# Patient Record
Sex: Male | Born: 1990 | Race: Black or African American | Hispanic: No | Marital: Single | Smoking: Current every day smoker
Health system: Southern US, Community
[De-identification: ages and names within clinical notes are randomized; demographics above are authoritative.]

## PROBLEM LIST (undated history)

## (undated) DIAGNOSIS — J45909 Unspecified asthma, uncomplicated: Secondary | ICD-10-CM

---
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
## 2014-09-27 ENCOUNTER — Emergency Department (HOSPITAL_COMMUNITY)
Admission: EM | Admit: 2014-09-27 | Discharge: 2014-09-27 | Disposition: A | Discharge status: Home / Self Care | Payer: Self-pay | Attending: Emergency Medicine | Admitting: Emergency Medicine

## 2014-09-27 ENCOUNTER — Encounter (HOSPITAL_COMMUNITY): Payer: Self-pay | Admitting: Emergency Medicine

## 2014-09-27 DIAGNOSIS — Z720 Tobacco use: Secondary | ICD-10-CM | POA: Insufficient documentation

## 2014-09-27 DIAGNOSIS — R300 Dysuria: Secondary | ICD-10-CM | POA: Insufficient documentation

## 2014-09-27 LAB — URINALYSIS, ROUTINE W REFLEX MICROSCOPIC
BILIRUBIN URINE: NEGATIVE
Glucose, UA: NEGATIVE mg/dL
HGB URINE DIPSTICK: NEGATIVE
Ketones, ur: NEGATIVE mg/dL
Leukocytes, UA: NEGATIVE
NITRITE: NEGATIVE
PH: 6 (ref 5.0–8.0)
Protein, ur: NEGATIVE mg/dL
SPECIFIC GRAVITY, URINE: 1.026 (ref 1.005–1.030)
UROBILINOGEN UA: 1 mg/dL (ref 0.0–1.0)

## 2014-09-27 MED ORDER — DOXYCYCLINE HYCLATE 100 MG PO CAPS: 100.0000 mg | ORAL_CAPSULE | Freq: Two times a day (BID) | ORAL | Status: DC

## 2014-09-27 NOTE — Discharge Instructions (Signed)

## 2014-09-27 NOTE — ED Notes (Signed)
Pt c/o painful urination x 1 day. Denies d/c or bleeding.

## 2014-09-27 NOTE — ED Provider Notes (Signed)
CSN: [id number]     Arrival date & time 09/27/14  1527 History  This chart was scribed for non-physician practitioner, Langston MaskerKaren Sofia, PA-C, working with Purvis SheffieldForrest Harrison, MD by Charline BillsEssence Howell, ED Scribe. This patient was seen in room WTR7/WTR7 and the patient's care was started at 4:29 PM.   Chief Complaint  Patient presents with  . Dysuria   The history is provided by the patient. No language interpreter was used.   HPI Comments: Christian Salazar is a 23 y.o. male who presents to the Emergency Department complaining of sudden onset of gradually worsening dysuria since this morning. Pt fever, chills, hematuria, penile discharge. He also denies STD exposure. No h/o kidney stones or UTIs. Pt reports alcohol and tobacco use. Pt also requests medical clearance for admittance into [facility], a substance abuse recovery program,  in [place], [place].   History reviewed. No pertinent past medical history. History reviewed. No pertinent past surgical history. No family history on file. History  Substance Use Topics  . Smoking status: Current Every Day Smoker  . Smokeless tobacco: Not on file  . Alcohol Use: Yes    Review of Systems  Constitutional: Negative for fever and chills.  Genitourinary: Positive for dysuria. Negative for hematuria and discharge.  All other systems reviewed and are negative.  Allergies  Review of patient's allergies indicates no known allergies.  Home Medications   Prior to Admission medications   Medication Sig Start Date End Date Taking? Authorizing Provider  albuterol (PROVENTIL HFA;VENTOLIN HFA) 108 (90 BASE) MCG/ACT inhaler Inhale 2 puffs into the lungs every 6 (six) hours as needed for wheezing or shortness of breath.   Yes Historical Provider, MD   Triage Vitals: BP 142/82 mmHg  Pulse 69  Temp(Src) 98 F (36.7 C) (Oral)  Resp 20  SpO2 100% Physical Exam  Constitutional: He is oriented to person, place, and time. He appears well-developed and well-nourished. No  distress.  HENT:  Head: Normocephalic and atraumatic.  Eyes: Conjunctivae and EOM are normal.  Neck: Neck supple. No tracheal deviation present.  Cardiovascular: Normal rate.   Pulmonary/Chest: Effort normal. No respiratory distress.  Musculoskeletal: Normal range of motion.  Neurological: He is alert and oriented to person, place, and time.  Skin: Skin is warm and dry.  Psychiatric: He has a normal mood and affect. His behavior is normal.  Nursing note and vitals reviewed.  ED Course  Procedures (including critical care time) DIAGNOSTIC STUDIES: Oxygen Saturation is 100% on RA, normal by my interpretation.    COORDINATION OF CARE: 4:32 PM-Discussed treatment plan which includes UA and Vibramycin with pt at bedside and pt agreed to plan.   Labs Review Labs Reviewed  GC/CHLAMYDIA PROBE AMP  URINALYSIS, ROUTINE W REFLEX MICROSCOPIC   Imaging Review No results found.   EKG Interpretation None      MDM   Final diagnoses:  Dysuria    Gc and Ct pending.   Pt given rx for doxycycline  I personally performed the services described in this documentation, which was scribed in my presence. The recorded information has been reviewed and is accurate.    Lonia SkinnerLeslie K [place]ofia, PA-C 09/27/14 1900  Purvis SheffieldForrest Harrison, MD 09/28/14 Windell [id number]

## 2014-09-29 LAB — GC/CHLAMYDIA PROBE AMP
CT PROBE, AMP APTIMA: NEGATIVE
GC PROBE AMP APTIMA: NEGATIVE

## 2015-04-20 ENCOUNTER — Emergency Department (HOSPITAL_COMMUNITY): Payer: Self-pay

## 2015-04-20 ENCOUNTER — Emergency Department (HOSPITAL_COMMUNITY)
Admission: EM | Admit: 2015-04-20 | Discharge: 2015-04-20 | Disposition: A | Discharge status: Home / Self Care | Payer: Self-pay | Attending: Emergency Medicine | Admitting: Emergency Medicine

## 2015-04-20 ENCOUNTER — Encounter (HOSPITAL_COMMUNITY): Payer: Self-pay | Admitting: Nurse Practitioner

## 2015-04-20 DIAGNOSIS — Y9301 Activity, walking, marching and hiking: Secondary | ICD-10-CM | POA: Insufficient documentation

## 2015-04-20 DIAGNOSIS — W3400XA Accidental discharge from unspecified firearms or gun, initial encounter: Secondary | ICD-10-CM

## 2015-04-20 DIAGNOSIS — Y999 Unspecified external cause status: Secondary | ICD-10-CM | POA: Insufficient documentation

## 2015-04-20 DIAGNOSIS — J45909 Unspecified asthma, uncomplicated: Secondary | ICD-10-CM | POA: Insufficient documentation

## 2015-04-20 DIAGNOSIS — S81802A Unspecified open wound, left lower leg, initial encounter: Secondary | ICD-10-CM | POA: Insufficient documentation

## 2015-04-20 DIAGNOSIS — Y92410 Unspecified street and highway as the place of occurrence of the external cause: Secondary | ICD-10-CM | POA: Insufficient documentation

## 2015-04-20 DIAGNOSIS — Z720 Tobacco use: Secondary | ICD-10-CM | POA: Insufficient documentation

## 2015-04-20 DIAGNOSIS — Z23 Encounter for immunization: Secondary | ICD-10-CM | POA: Insufficient documentation

## 2015-04-20 HISTORY — DX: Unspecified asthma, uncomplicated: J45.909

## 2015-04-20 MED ORDER — CEPHALEXIN 250 MG PO CAPS
500.0000 mg | ORAL_CAPSULE | Freq: Once | ORAL | Status: AC
  Administered 2015-04-20: 500 mg via ORAL
  Filled 2015-04-20: qty 2

## 2015-04-20 MED ORDER — DOXYCYCLINE HYCLATE 100 MG PO TABS
100.0000 mg | ORAL_TABLET | Freq: Once | ORAL | Status: AC
  Administered 2015-04-20: 100 mg via ORAL
  Filled 2015-04-20: qty 1

## 2015-04-20 MED ORDER — TETANUS-DIPHTH-ACELL PERTUSSIS 5-2.5-18.5 LF-MCG/0.5 IM SUSP
0.5000 mL | Freq: Once | INTRAMUSCULAR | Status: AC
  Administered 2015-04-20: 0.5 mL via INTRAMUSCULAR
  Filled 2015-04-20: qty 0.5

## 2015-04-20 MED ORDER — DOXYCYCLINE HYCLATE 100 MG PO TABS
100.0000 mg | ORAL_TABLET | Freq: Two times a day (BID) | ORAL | Status: DC
Start: 2015-04-20 — End: 2015-10-26

## 2015-04-20 MED ORDER — CEPHALEXIN 500 MG PO CAPS: 500.0000 mg | ORAL_CAPSULE | Freq: Two times a day (BID) | ORAL | Status: DC

## 2015-04-20 NOTE — ED Notes (Signed)
Patient transported to X-ray 
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
## 2015-10-18 ENCOUNTER — Encounter (HOSPITAL_COMMUNITY): Payer: Self-pay | Admitting: Radiology

## 2015-10-18 ENCOUNTER — Emergency Department (HOSPITAL_COMMUNITY)
Admission: EM | Admit: 2015-10-18 | Discharge: 2015-10-18 | Disposition: A | Discharge status: Home / Self Care | Payer: Self-pay | Attending: Emergency Medicine | Admitting: Emergency Medicine

## 2015-10-18 ENCOUNTER — Emergency Department (HOSPITAL_COMMUNITY): Payer: Self-pay

## 2015-10-18 DIAGNOSIS — S31803A Puncture wound without foreign body of unspecified buttock, initial encounter: Secondary | ICD-10-CM

## 2015-10-18 DIAGNOSIS — J45901 Unspecified asthma with (acute) exacerbation: Secondary | ICD-10-CM | POA: Insufficient documentation

## 2015-10-18 DIAGNOSIS — S71001A Unspecified open wound, right hip, initial encounter: Secondary | ICD-10-CM | POA: Insufficient documentation

## 2015-10-18 DIAGNOSIS — S71002A Unspecified open wound, left hip, initial encounter: Secondary | ICD-10-CM | POA: Insufficient documentation

## 2015-10-18 DIAGNOSIS — Y92009 Unspecified place in unspecified non-institutional (private) residence as the place of occurrence of the external cause: Secondary | ICD-10-CM | POA: Insufficient documentation

## 2015-10-18 DIAGNOSIS — Z23 Encounter for immunization: Secondary | ICD-10-CM | POA: Insufficient documentation

## 2015-10-18 DIAGNOSIS — Y999 Unspecified external cause status: Secondary | ICD-10-CM | POA: Insufficient documentation

## 2015-10-18 DIAGNOSIS — Y9301 Activity, walking, marching and hiking: Secondary | ICD-10-CM | POA: Insufficient documentation

## 2015-10-18 DIAGNOSIS — W3400XA Accidental discharge from unspecified firearms or gun, initial encounter: Secondary | ICD-10-CM

## 2015-10-18 LAB — COMPREHENSIVE METABOLIC PANEL
ALBUMIN: 4.2 g/dL (ref 3.5–5.0)
ALT: 13 U/L — ABNORMAL LOW (ref 17–63)
ANION GAP: 18 — AB (ref 5–15)
AST: 36 U/L (ref 15–41)
Alkaline Phosphatase: 90 U/L (ref 38–126)
BILIRUBIN TOTAL: 0.6 mg/dL (ref 0.3–1.2)
BUN: 9 mg/dL (ref 6–20)
CHLORIDE: 108 mmol/L (ref 101–111)
CO2: 16 mmol/L — AB (ref 22–32)
Calcium: 9.4 mg/dL (ref 8.9–10.3)
Creatinine, Ser: 1.63 mg/dL — ABNORMAL HIGH (ref 0.61–1.24)
GFR calc Af Amer: >60 mL/min (ref >60)
GFR calc non Af Amer: 58 mL/min — ABNORMAL LOW (ref >60)
GLUCOSE: 110 mg/dL — AB (ref 65–99)
POTASSIUM: 4.6 mmol/L (ref 3.5–5.1)
SODIUM: 142 mmol/L (ref 135–145)
TOTAL PROTEIN: 7.9 g/dL (ref 6.5–8.1)

## 2015-10-18 LAB — CDS SEROLOGY

## 2015-10-18 LAB — PREPARE FRESH FROZEN PLASMA
UNIT DIVISION: 0
Unit division: 0

## 2015-10-18 LAB — CBC
HEMATOCRIT: 46.4 % (ref 39.0–52.0)
HEMOGLOBIN: 15.3 g/dL (ref 13.0–17.0)
MCH: 31.9 pg (ref 26.0–34.0)
MCHC: 33 g/dL (ref 30.0–36.0)
MCV: 96.9 fL (ref 78.0–100.0)
Platelets: 341 10*3/uL (ref 150–400)
RBC: 4.79 MIL/uL (ref 4.22–5.81)
RDW: 13.3 % (ref 11.5–15.5)
WBC: 15.3 10*3/uL — ABNORMAL HIGH (ref 4.0–10.5)

## 2015-10-18 LAB — I-STAT CHEM 8, ED
BUN: 10 mg/dL (ref 6–20)
CHLORIDE: 108 mmol/L (ref 101–111)
CREATININE: 1.6 mg/dL — AB (ref 0.61–1.24)
Calcium, Ion: 1.07 mmol/L — ABNORMAL LOW (ref 1.12–1.23)
GLUCOSE: 104 mg/dL — AB (ref 65–99)
HEMATOCRIT: 48 % (ref 39.0–52.0)
HEMOGLOBIN: 16.3 g/dL (ref 13.0–17.0)
POTASSIUM: 4.3 mmol/L (ref 3.5–5.1)
Sodium: 142 mmol/L (ref 135–145)
TCO2: 16 mmol/L (ref 0–100)

## 2015-10-18 LAB — ABO/RH: ABO/RH(D): O NEG

## 2015-10-18 LAB — BLOOD PRODUCT ORDER (VERBAL) VERIFICATION

## 2015-10-18 LAB — PROTIME-INR
INR: 1.07 (ref 0.00–1.49)
PROTHROMBIN TIME: 14.1 s (ref 11.6–15.2)

## 2015-10-18 LAB — ETHANOL: Alcohol, Ethyl (B): 197 mg/dL — ABNORMAL HIGH (ref <5)

## 2015-10-18 MED ORDER — TETANUS-DIPHTH-ACELL PERTUSSIS 5-2.5-18.5 LF-MCG/0.5 IM SUSP
0.5000 mL | Freq: Once | INTRAMUSCULAR | Status: AC
  Administered 2015-10-18: 0.5 mL via INTRAMUSCULAR
  Filled 2015-10-18: qty 0.5

## 2015-10-18 MED ORDER — FENTANYL CITRATE (PF) 100 MCG/2ML IJ SOLN
INTRAMUSCULAR | Status: AC
  Filled 2015-10-18: qty 2

## 2015-10-18 MED ORDER — FENTANYL CITRATE (PF) 100 MCG/2ML IJ SOLN
INTRAMUSCULAR | Status: AC | PRN
  Administered 2015-10-18: 100 ug via INTRAVENOUS

## 2015-10-18 MED ORDER — ALBUTEROL SULFATE HFA 108 (90 BASE) MCG/ACT IN AERS
2.0000 | INHALATION_SPRAY | Freq: Once | RESPIRATORY_TRACT | Status: AC
  Administered 2015-10-18: 2 via RESPIRATORY_TRACT
  Filled 2015-10-18: qty 6.7

## 2015-10-18 MED ORDER — IPRATROPIUM-ALBUTEROL 0.5-2.5 (3) MG/3ML IN SOLN
3.0000 mL | Freq: Once | RESPIRATORY_TRACT | Status: AC
  Administered 2015-10-18: 3 mL via RESPIRATORY_TRACT
  Filled 2015-10-18: qty 3

## 2015-10-18 MED ORDER — IOHEXOL 300 MG/ML  SOLN
100.0000 mL | Freq: Once | INTRAMUSCULAR | Status: AC | PRN
  Administered 2015-10-18: 100 mL via INTRAVENOUS
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
## 2015-10-18 NOTE — ED Notes (Signed)
Per [employer] and [employer] pt can have all of his belongings.
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
## 2015-10-19 LAB — TYPE AND SCREEN
ABO/RH(D): O NEG
Antibody Screen: NEGATIVE
UNIT DIVISION: 0
Unit division: 0

## 2015-10-21 ENCOUNTER — Encounter (HOSPITAL_COMMUNITY): Payer: Self-pay | Admitting: Nurse Practitioner

## 2015-10-23 ENCOUNTER — Encounter (HOSPITAL_COMMUNITY): Payer: Self-pay | Admitting: *Deleted

## 2015-10-23 ENCOUNTER — Emergency Department (HOSPITAL_COMMUNITY): Payer: Self-pay

## 2015-10-23 ENCOUNTER — Inpatient Hospital Stay (HOSPITAL_COMMUNITY)
Admission: EM | Admit: 2015-10-23 | Discharge: 2015-10-26 | DRG: 603 | Disposition: A | Discharge status: Home / Self Care | Payer: Self-pay | Attending: General Surgery | Admitting: General Surgery

## 2015-10-23 DIAGNOSIS — T148XXA Other injury of unspecified body region, initial encounter: Secondary | ICD-10-CM

## 2015-10-23 DIAGNOSIS — L089 Local infection of the skin and subcutaneous tissue, unspecified: Secondary | ICD-10-CM

## 2015-10-23 DIAGNOSIS — S31814A Puncture wound with foreign body of right buttock, initial encounter: Secondary | ICD-10-CM | POA: Diagnosis present

## 2015-10-23 DIAGNOSIS — W3400XA Accidental discharge from unspecified firearms or gun, initial encounter: Secondary | ICD-10-CM

## 2015-10-23 DIAGNOSIS — L0231 Cutaneous abscess of buttock: Principal | ICD-10-CM | POA: Diagnosis present

## 2015-10-23 DIAGNOSIS — J45909 Unspecified asthma, uncomplicated: Secondary | ICD-10-CM | POA: Diagnosis present

## 2015-10-23 DIAGNOSIS — F17210 Nicotine dependence, cigarettes, uncomplicated: Secondary | ICD-10-CM | POA: Diagnosis present

## 2015-10-23 DIAGNOSIS — S31824A Puncture wound with foreign body of left buttock, initial encounter: Secondary | ICD-10-CM | POA: Diagnosis present

## 2015-10-23 LAB — CBC WITH DIFFERENTIAL/PLATELET
BASOS ABS: 0.1 10*3/uL (ref 0.0–0.1)
Basophils Relative: 1 %
EOS ABS: 0.5 10*3/uL (ref 0.0–0.7)
EOS PCT: 7 %
HCT: 39.2 % (ref 39.0–52.0)
Hemoglobin: 13.4 g/dL (ref 13.0–17.0)
LYMPHS ABS: 1.9 10*3/uL (ref 0.7–4.0)
LYMPHS PCT: 24 %
MCH: 31.8 pg (ref 26.0–34.0)
MCHC: 34.2 g/dL (ref 30.0–36.0)
MCV: 92.9 fL (ref 78.0–100.0)
MONO ABS: 0.8 10*3/uL (ref 0.1–1.0)
Monocytes Relative: 10 %
NEUTROS PCT: 58 %
Neutro Abs: 4.6 10*3/uL (ref 1.7–7.7)
PLATELETS: 366 10*3/uL (ref 150–400)
RBC: 4.22 MIL/uL (ref 4.22–5.81)
RDW: 12.4 % (ref 11.5–15.5)
WBC: 7.9 10*3/uL (ref 4.0–10.5)

## 2015-10-23 LAB — BASIC METABOLIC PANEL
Anion gap: 7 (ref 5–15)
BUN: 10 mg/dL (ref 6–20)
CALCIUM: 9.4 mg/dL (ref 8.9–10.3)
CO2: 29 mmol/L (ref 22–32)
Chloride: 104 mmol/L (ref 101–111)
Creatinine, Ser: 1.02 mg/dL (ref 0.61–1.24)
GFR calc Af Amer: >60 mL/min (ref >60)
GLUCOSE: 105 mg/dL — AB (ref 65–99)
Potassium: 4.2 mmol/L (ref 3.5–5.1)
SODIUM: 140 mmol/L (ref 135–145)

## 2015-10-23 MED ORDER — ONDANSETRON HCL 4 MG/2ML IJ SOLN
4.0000 mg | Freq: Once | INTRAMUSCULAR | Status: AC
Start: 2015-10-23 — End: 2015-10-23
  Administered 2015-10-23: 4 mg via INTRAVENOUS
  Filled 2015-10-23: qty 2

## 2015-10-23 MED ORDER — IOHEXOL 300 MG/ML  SOLN
100.0000 mL | Freq: Once | INTRAMUSCULAR | Status: AC | PRN
  Administered 2015-10-23: 100 mL via INTRAVENOUS

## 2015-10-23 MED ORDER — SODIUM CHLORIDE 0.9 % IV BOLUS (SEPSIS)
1000.0000 mL | Freq: Once | INTRAVENOUS | Status: AC
  Administered 2015-10-23: 1000 mL via INTRAVENOUS

## 2015-10-23 MED ORDER — MORPHINE SULFATE (PF) 4 MG/ML IV SOLN
4.0000 mg | Freq: Once | INTRAVENOUS | Status: AC
  Administered 2015-10-23: 4 mg via INTRAVENOUS
  Filled 2015-10-23: qty 1

## 2015-10-23 MED ORDER — CLINDAMYCIN PHOSPHATE 600 MG/50ML IV SOLN
600.0000 mg | Freq: Once | INTRAVENOUS | Status: AC
Start: 2015-10-23 — End: 2015-10-23
  Administered 2015-10-23: 600 mg via INTRAVENOUS
  Filled 2015-10-23: qty 50

## 2015-10-23 NOTE — ED Notes (Signed)
Patient transported to CT 
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
## 2015-10-23 NOTE — ED Notes (Signed)
Bed: [id number] Expected date:  Expected time:  Means of arrival:  Comments: For room 26
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
## 2015-10-24 ENCOUNTER — Observation Stay (HOSPITAL_COMMUNITY): Payer: Self-pay | Admitting: Anesthesiology

## 2015-10-24 ENCOUNTER — Encounter (HOSPITAL_COMMUNITY): Admission: EM | Disposition: A | Discharge status: Home / Self Care | Payer: Self-pay | Source: Home / Self Care

## 2015-10-24 DIAGNOSIS — L0231 Cutaneous abscess of buttock: Secondary | ICD-10-CM | POA: Diagnosis present

## 2015-10-24 DIAGNOSIS — W3400XA Accidental discharge from unspecified firearms or gun, initial encounter: Secondary | ICD-10-CM

## 2015-10-24 HISTORY — PX: INCISION AND DRAINAGE ABSCESS: SHX5864

## 2015-10-24 LAB — CBC
HEMATOCRIT: 37.2 % — AB (ref 39.0–52.0)
Hemoglobin: 12.9 g/dL — ABNORMAL LOW (ref 13.0–17.0)
MCH: 32.5 pg (ref 26.0–34.0)
MCHC: 34.7 g/dL (ref 30.0–36.0)
MCV: 93.7 fL (ref 78.0–100.0)
PLATELETS: 363 10*3/uL (ref 150–400)
RBC: 3.97 MIL/uL — AB (ref 4.22–5.81)
RDW: 12.5 % (ref 11.5–15.5)
WBC: 8.1 10*3/uL (ref 4.0–10.5)

## 2015-10-24 LAB — CREATININE, SERUM
Creatinine, Ser: 0.97 mg/dL (ref 0.61–1.24)
GFR calc non Af Amer: >60 mL/min (ref >60)

## 2015-10-24 SURGERY — INCISION AND DRAINAGE, ABSCESS
Anesthesia: General | Site: Buttocks | Laterality: Bilateral

## 2015-10-24 MED ORDER — MEPERIDINE HCL 25 MG/ML IJ SOLN
6.2500 mg | INTRAMUSCULAR | Status: DC | PRN
  Administered 2015-10-24 (×2): 12.5 mg via INTRAVENOUS

## 2015-10-24 MED ORDER — SODIUM CHLORIDE 0.9 % IR SOLN
Status: DC | PRN
  Administered 2015-10-24: 1000 mL

## 2015-10-24 MED ORDER — GLYCOPYRROLATE 0.2 MG/ML IJ SOLN
INTRAMUSCULAR | Status: DC | PRN
  Administered 2015-10-24: .6 mg via INTRAVENOUS

## 2015-10-24 MED ORDER — NEOSTIGMINE METHYLSULFATE 10 MG/10ML IV SOLN
INTRAVENOUS | Status: DC | PRN
  Administered 2015-10-24: 4 mg via INTRAVENOUS

## 2015-10-24 MED ORDER — HEPARIN SODIUM (PORCINE) 5000 UNIT/ML IJ SOLN
5000.0000 [IU] | Freq: Three times a day (TID) | INTRAMUSCULAR | Status: DC
  Administered 2015-10-24 – 2015-10-26 (×6): 5000 [IU] via SUBCUTANEOUS
  Filled 2015-10-24 (×5): qty 1

## 2015-10-24 MED ORDER — MIDAZOLAM HCL 2 MG/2ML IJ SOLN
INTRAMUSCULAR | Status: AC
  Filled 2015-10-24: qty 2

## 2015-10-24 MED ORDER — DEXTROSE 5 % IV SOLN
1.0000 g | INTRAVENOUS | Status: DC
  Administered 2015-10-24 – 2015-10-25 (×2): 1 g via INTRAVENOUS
  Filled 2015-10-24 (×3): qty 10

## 2015-10-24 MED ORDER — LIDOCAINE HCL (CARDIAC) 20 MG/ML IV SOLN
INTRAVENOUS | Status: DC | PRN
  Administered 2015-10-24: 60 mg via INTRAVENOUS

## 2015-10-24 MED ORDER — MORPHINE SULFATE (PF) 2 MG/ML IV SOLN
1.0000 mg | INTRAVENOUS | Status: DC | PRN
  Administered 2015-10-24 – 2015-10-25 (×5): 2 mg via INTRAVENOUS
  Filled 2015-10-24 (×5): qty 1

## 2015-10-24 MED ORDER — MORPHINE SULFATE (PF) 2 MG/ML IV SOLN: 1.0000 mg | INTRAVENOUS | Status: DC | PRN

## 2015-10-24 MED ORDER — VANCOMYCIN HCL IN DEXTROSE 1-5 GM/200ML-% IV SOLN
1000.0000 mg | INTRAVENOUS | Status: AC
  Administered 2015-10-24: 1000 mg via INTRAVENOUS
  Filled 2015-10-24: qty 200

## 2015-10-24 MED ORDER — ONDANSETRON HCL 4 MG/2ML IJ SOLN
INTRAMUSCULAR | Status: DC | PRN
  Administered 2015-10-24: 4 mg via INTRAVENOUS

## 2015-10-24 MED ORDER — NEOSTIGMINE METHYLSULFATE 10 MG/10ML IV SOLN
INTRAVENOUS | Status: AC
  Filled 2015-10-24: qty 1

## 2015-10-24 MED ORDER — DEXTROSE 5 % IV SOLN
2.0000 g | Freq: Once | INTRAVENOUS | Status: AC
  Administered 2015-10-24: 2 g via INTRAVENOUS
  Filled 2015-10-24: qty 2

## 2015-10-24 MED ORDER — PROPOFOL 10 MG/ML IV BOLUS
INTRAVENOUS | Status: DC | PRN
  Administered 2015-10-24: 20 mg via INTRAVENOUS
  Administered 2015-10-24: 160 mg via INTRAVENOUS

## 2015-10-24 MED ORDER — ONDANSETRON HCL 4 MG PO TABS: 4.0000 mg | ORAL_TABLET | Freq: Four times a day (QID) | ORAL | Status: DC | PRN

## 2015-10-24 MED ORDER — ONDANSETRON HCL 4 MG/2ML IJ SOLN
INTRAMUSCULAR | Status: AC
  Filled 2015-10-24: qty 2

## 2015-10-24 MED ORDER — ROCURONIUM BROMIDE 50 MG/5ML IV SOLN
INTRAVENOUS | Status: AC
  Filled 2015-10-24: qty 1

## 2015-10-24 MED ORDER — LACTATED RINGERS IV SOLN
INTRAVENOUS | Status: DC | PRN
  Administered 2015-10-24: 11:00:00 via INTRAVENOUS

## 2015-10-24 MED ORDER — KCL IN DEXTROSE-NACL 20-5-0.45 MEQ/L-%-% IV SOLN
INTRAVENOUS | Status: DC
  Administered 2015-10-24 (×2): via INTRAVENOUS
  Filled 2015-10-24 (×2): qty 1000

## 2015-10-24 MED ORDER — FENTANYL CITRATE (PF) 100 MCG/2ML IJ SOLN
INTRAMUSCULAR | Status: AC
  Filled 2015-10-24: qty 2

## 2015-10-24 MED ORDER — MIDAZOLAM HCL 5 MG/5ML IJ SOLN
INTRAMUSCULAR | Status: DC | PRN
  Administered 2015-10-24: 2 mg via INTRAVENOUS

## 2015-10-24 MED ORDER — ONDANSETRON HCL 4 MG/2ML IJ SOLN: 4.0000 mg | Freq: Once | INTRAMUSCULAR | Status: DC | PRN

## 2015-10-24 MED ORDER — OXYCODONE HCL 5 MG PO TABS
5.0000 mg | ORAL_TABLET | ORAL | Status: DC | PRN
  Administered 2015-10-24 – 2015-10-26 (×7): 10 mg via ORAL
  Filled 2015-10-24 (×7): qty 2

## 2015-10-24 MED ORDER — PROPOFOL 10 MG/ML IV BOLUS
INTRAVENOUS | Status: AC
  Filled 2015-10-24: qty 20

## 2015-10-24 MED ORDER — GLYCOPYRROLATE 0.2 MG/ML IJ SOLN
INTRAMUSCULAR | Status: AC
  Filled 2015-10-24: qty 1

## 2015-10-24 MED ORDER — LIDOCAINE HCL (CARDIAC) 20 MG/ML IV SOLN
INTRAVENOUS | Status: AC
  Filled 2015-10-24: qty 5

## 2015-10-24 MED ORDER — MEPERIDINE HCL 25 MG/ML IJ SOLN
INTRAMUSCULAR | Status: AC
  Filled 2015-10-24: qty 1

## 2015-10-24 MED ORDER — FENTANYL CITRATE (PF) 100 MCG/2ML IJ SOLN
25.0000 ug | INTRAMUSCULAR | Status: DC | PRN
  Administered 2015-10-24 (×4): 25 ug via INTRAVENOUS

## 2015-10-24 MED ORDER — ROCURONIUM BROMIDE 100 MG/10ML IV SOLN
INTRAVENOUS | Status: DC | PRN
  Administered 2015-10-24: 40 mg via INTRAVENOUS

## 2015-10-24 MED ORDER — LACTATED RINGERS IV SOLN
INTRAVENOUS | Status: DC
  Administered 2015-10-24: 11:00:00 via INTRAVENOUS

## 2015-10-24 MED ORDER — FENTANYL CITRATE (PF) 100 MCG/2ML IJ SOLN
INTRAMUSCULAR | Status: DC | PRN
  Administered 2015-10-24: 50 ug via INTRAVENOUS
  Administered 2015-10-24: 150 ug via INTRAVENOUS
  Administered 2015-10-24: 50 ug via INTRAVENOUS

## 2015-10-24 MED ORDER — ONDANSETRON HCL 4 MG/2ML IJ SOLN: 4.0000 mg | Freq: Four times a day (QID) | INTRAMUSCULAR | Status: DC | PRN

## 2015-10-24 MED ORDER — FENTANYL CITRATE (PF) 250 MCG/5ML IJ SOLN
INTRAMUSCULAR | Status: AC
  Filled 2015-10-24: qty 5

## 2015-10-24 SURGICAL SUPPLY — 29 items
BNDG GAUZE ELAST 4 BULKY (GAUZE/BANDAGES/DRESSINGS) IMPLANT
CANISTER SUCTION 2500CC (MISCELLANEOUS) ×3 IMPLANT
COVER SURGICAL LIGHT HANDLE (MISCELLANEOUS) ×3 IMPLANT
DRAIN PENROSE 1/4X12 LTX STRL (WOUND CARE) ×6 IMPLANT
DRAPE LAPAROSCOPIC ABDOMINAL (DRAPES) ×3 IMPLANT
DRAPE UTILITY XL STRL (DRAPES) ×6 IMPLANT
DRSG PAD ABDOMINAL 8X10 ST (GAUZE/BANDAGES/DRESSINGS) ×3 IMPLANT
ELECT CAUTERY BLADE 6.4 (BLADE) ×3 IMPLANT
ELECT REM PT RETURN 9FT ADLT (ELECTROSURGICAL) ×3
ELECTRODE REM PT RTRN 9FT ADLT (ELECTROSURGICAL) ×1 IMPLANT
GAUZE SPONGE 4X4 12PLY STRL (GAUZE/BANDAGES/DRESSINGS) ×3 IMPLANT
GLOVE BIOGEL PI IND STRL 7.0 (GLOVE) ×2 IMPLANT
GLOVE BIOGEL PI IND STRL 8 (GLOVE) ×1 IMPLANT
GLOVE BIOGEL PI INDICATOR 7.0 (GLOVE) ×4
GLOVE BIOGEL PI INDICATOR 8 (GLOVE) ×2
GLOVE ECLIPSE 7.5 STRL STRAW (GLOVE) ×6 IMPLANT
GOWN STRL REUS W/ TWL LRG LVL3 (GOWN DISPOSABLE) ×2 IMPLANT
GOWN STRL REUS W/TWL LRG LVL3 (GOWN DISPOSABLE) ×4
KIT BASIN OR (CUSTOM PROCEDURE TRAY) ×3 IMPLANT
KIT ROOM TURNOVER OR (KITS) ×3 IMPLANT
NS IRRIG 1000ML POUR BTL (IV SOLUTION) ×3 IMPLANT
PACK GENERAL/GYN (CUSTOM PROCEDURE TRAY) ×3 IMPLANT
PAD ARMBOARD 7.5X6 YLW CONV (MISCELLANEOUS) ×3 IMPLANT
SUCTION POOLE TIP (SUCTIONS) ×3 IMPLANT
SUT ETHILON 3 0 FSL (SUTURE) ×3 IMPLANT
SWAB COLLECTION DEVICE MRSA (MISCELLANEOUS) IMPLANT
TOWEL OR 17X24 6PK STRL BLUE (TOWEL DISPOSABLE) ×3 IMPLANT
TOWEL OR 17X26 10 PK STRL BLUE (TOWEL DISPOSABLE) IMPLANT
TUBE ANAEROBIC SPECIMEN COL (MISCELLANEOUS) IMPLANT

## 2015-10-24 NOTE — Anesthesia Preprocedure Evaluation (Addendum)
Anesthesia Evaluation  Patient identified by MRN, date of birth, ID band Patient awake    Reviewed: Allergy & Precautions, NPO status , Patient's Chart, lab work & pertinent test results  History of Anesthesia Complications Negative for: history of anesthetic complications  Airway Mallampati: II  TM Distance: >3 FB Neck ROM: Full    Dental no notable dental hx. (+) Dental Advisory Given   Pulmonary asthma , Current Smoker,    Pulmonary exam normal breath sounds clear to auscultation       Cardiovascular negative cardio ROS Normal cardiovascular exam Rhythm:Regular Rate:Normal     Neuro/Psych negative neurological ROS  negative psych ROS   GI/Hepatic negative GI ROS, Neg liver ROS,   Endo/Other  negative endocrine ROS  Renal/GU negative Renal ROS  negative genitourinary   Musculoskeletal negative musculoskeletal ROS (+)   Abdominal   Peds negative pediatric ROS (+)  Hematology negative hematology ROS (+)   Anesthesia Other Findings   Reproductive/Obstetrics negative OB ROS                            Anesthesia Physical Anesthesia Plan  ASA: II  Anesthesia Plan: General   Post-op Pain Management:    Induction: Intravenous  Airway Management Planned: Oral ETT  Additional Equipment:   Intra-op Plan:   Post-operative Plan: Extubation in OR  Informed Consent: I have reviewed the patients History and Physical, chart, labs and discussed the procedure including the risks, benefits and alternatives for the proposed anesthesia with the patient or authorized representative who has indicated his/her understanding and acceptance.   Dental advisory given  Plan Discussed with: CRNA  Anesthesia Plan Comments:         Anesthesia Quick Evaluation

## 2015-10-24 NOTE — Progress Notes (Signed)
Looks like a lot of infected serous fluid from the left gluteal wound.  Will examine the right gluteal wound also at the time of surgery.  Needs I&D in the OR.  Marta LamasJames O. Gae BonWyatt, III, MD, FACS [phone] Trauma Surgeon
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
## 2015-10-24 NOTE — Progress Notes (Signed)
Pt is transported to OR
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
## 2015-10-24 NOTE — Anesthesia Postprocedure Evaluation (Signed)
Anesthesia Post Note  Patient: Christian Salazar  Procedure(s) Performed: Procedure(s) (LRB): INCISION AND DRAINAGE ABSCESS (Bilateral)  Patient location during evaluation: PACU Anesthesia Type: General Level of consciousness: awake and alert Pain management: pain level controlled Vital Signs Assessment: post-procedure vital signs reviewed and stable Respiratory status: spontaneous breathing, nonlabored ventilation, respiratory function stable and patient connected to nasal cannula oxygen Cardiovascular status: blood pressure returned to baseline and stable Postop Assessment: no signs of nausea or vomiting Anesthetic complications: no    Last Vitals:  Filed Vitals:   10/24/15 1352 10/24/15 1433  BP:  110/50  Pulse:  60  Temp: 36.8 C 37 C  Resp:      Last Pain:  Filed Vitals:   10/24/15 1434  PainSc: Asleep                 Yoshi Mancillas JENNETTE

## 2015-10-24 NOTE — Progress Notes (Signed)
Pt is transferred back to 5N14 from PACU. Readmission vital sign is stable.

## 2015-10-24 NOTE — Anesthesia Procedure Notes (Signed)
Procedure Name: Intubation Date/Time: 10/24/2015 11:36 AM Performed by: Virgel GessHOLTZMAN, Shemar Plemmons LEFFEW Pre-anesthesia Checklist: Patient identified, Patient being monitored, Timeout performed, Emergency Drugs available and Suction available Patient Re-evaluated:Patient Re-evaluated prior to inductionOxygen Delivery Method: Circle System Utilized Preoxygenation: Pre-oxygenation with 100% oxygen Intubation Type: IV induction Ventilation: Mask ventilation without difficulty Laryngoscope Size: Mac and 3 Grade View: Grade I Tube type: Oral Tube size: 7.5 mm Number of attempts: 1 Airway Equipment and Method: Stylet Placement Confirmation: ETT inserted through vocal cords under direct vision,  positive ETCO2 and breath sounds checked- equal and bilateral Secured at: 23 cm Tube secured with: Tape Dental Injury: Teeth and Oropharynx as per pre-operative assessment

## 2015-10-24 NOTE — Transfer of Care (Signed)
Immediate Anesthesia Transfer of Care Note  Patient: Christian Salazar  Procedure(s) Performed: Procedure(s) with comments: INCISION AND DRAINAGE ABSCESS (Bilateral) - Prone position  Patient Location: PACU  Anesthesia Type:General  Level of Consciousness: awake, alert , patient cooperative and responds to stimulation  Airway & Oxygen Therapy: Patient Spontanous Breathing  Post-op Assessment: Report given to RN, Post -op Vital signs reviewed and stable and Patient moving all extremities X 4  Post vital signs: Reviewed and stable  Last Vitals:  Filed Vitals:   10/24/15 0251 10/24/15 0548  BP: 135/63 117/65  Pulse: 73 60  Temp: 37.3 C 37.1 C  Resp: 18 18    Complications: No apparent anesthesia complications

## 2015-10-24 NOTE — Progress Notes (Addendum)
Pt starting moaning, grimacing and crying in pain just when the bed pad was changed under his buttocks. He is reluctant to move in the bed because of his pain. Morphine given for pain with only moderate relief. Dr. Magnus IvanBlackman notified since pt is suppose to have dressing changed in a.m. He stated he did not want to add any pain meds and to  delay dressing change until MD sees pt in the morning.

## 2015-10-25 ENCOUNTER — Encounter (HOSPITAL_COMMUNITY): Payer: Self-pay | Admitting: General Surgery

## 2015-10-25 MED ORDER — METHOCARBAMOL 750 MG PO TABS
750.0000 mg | ORAL_TABLET | Freq: Three times a day (TID) | ORAL | Status: DC
  Administered 2015-10-25 – 2015-10-26 (×5): 750 mg via ORAL
  Filled 2015-10-25 (×5): qty 1

## 2015-10-25 MED ORDER — KETOROLAC TROMETHAMINE 15 MG/ML IJ SOLN
15.0000 mg | Freq: Four times a day (QID) | INTRAMUSCULAR | Status: DC
  Administered 2015-10-25 – 2015-10-26 (×5): 15 mg via INTRAVENOUS
  Filled 2015-10-25 (×5): qty 1

## 2015-10-25 MED ORDER — KETOROLAC TROMETHAMINE 30 MG/ML IJ SOLN
30.0000 mg | Freq: Once | INTRAMUSCULAR | Status: AC
  Administered 2015-10-25: 30 mg via INTRAVENOUS
  Filled 2015-10-25: qty 1

## 2015-10-25 MED ORDER — INFLUENZA VAC SPLIT QUAD 0.5 ML IM SUSY
0.5000 mL | PREFILLED_SYRINGE | INTRAMUSCULAR | Status: AC
Start: 2015-10-26 — End: 2015-10-26
  Administered 2015-10-26: 0.5 mL via INTRAMUSCULAR
  Filled 2015-10-25: qty 0.5
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
## 2015-10-26 MED ORDER — AMOXICILLIN-POT CLAVULANATE 875-125 MG PO TABS: 1.0000 | ORAL_TABLET | Freq: Two times a day (BID) | ORAL | Status: DC

## 2015-10-26 MED ORDER — METHOCARBAMOL 750 MG PO TABS: 750.0000 mg | ORAL_TABLET | Freq: Four times a day (QID) | ORAL | Status: DC | PRN

## 2015-10-26 MED ORDER — OXYCODONE-ACETAMINOPHEN 5-325 MG PO TABS: 1.0000 | ORAL_TABLET | ORAL | Status: DC | PRN
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
## 2015-10-26 NOTE — Discharge Instructions (Signed)
Dry dressings to drain sites, change daily or more often if needed. Ok to shower with dressings off; do not soak wounds in tub. No driving while taking oxycodone.

## 2015-10-29 LAB — CULTURE, ROUTINE-ABSCESS

## 2015-10-30 LAB — ANAEROBIC CULTURE
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
## 2019-12-27 ENCOUNTER — Emergency Department (HOSPITAL_COMMUNITY)
Admission: EM | Admit: 2019-12-27 | Discharge: 2019-12-27 | Disposition: A | Discharge status: Home / Self Care | Payer: Self-pay | Attending: Emergency Medicine | Admitting: Emergency Medicine

## 2019-12-27 ENCOUNTER — Other Ambulatory Visit: Payer: Self-pay

## 2019-12-27 ENCOUNTER — Encounter (HOSPITAL_COMMUNITY): Payer: Self-pay

## 2019-12-27 DIAGNOSIS — H9201 Otalgia, right ear: Secondary | ICD-10-CM | POA: Insufficient documentation

## 2019-12-27 DIAGNOSIS — H6091 Unspecified otitis externa, right ear: Secondary | ICD-10-CM | POA: Insufficient documentation

## 2019-12-27 DIAGNOSIS — F17210 Nicotine dependence, cigarettes, uncomplicated: Secondary | ICD-10-CM | POA: Insufficient documentation

## 2019-12-27 DIAGNOSIS — F1210 Cannabis abuse, uncomplicated: Secondary | ICD-10-CM | POA: Insufficient documentation

## 2019-12-27 DIAGNOSIS — J45909 Unspecified asthma, uncomplicated: Secondary | ICD-10-CM | POA: Insufficient documentation

## 2019-12-27 MED ORDER — OFLOXACIN 0.3 % OT SOLN: 5.0000 [drp] | Freq: Two times a day (BID) | OTIC | 0 refills | Status: AC
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
## 2019-12-27 NOTE — ED Triage Notes (Signed)
Patient c/o right ear pain since yesterday and states, "It feels like something crawling in my right ear."

## 2019-12-27 NOTE — Discharge Instructions (Addendum)
Please use 1:1 dilution of 3% hydrogen peroxide if tympanic membrane is visible and intact.  I have sent a prescription for an antibiotic that you may use twice daily for the next week.  I recommend that you wait and see how your symptoms are tomorrow and if they are worse he may use antibiotic.  If they are not worse or are not improving I recommend just using Tylenol and ibuprofen for pain.   Please use Tylenol or ibuprofen for pain.  You may use 600 mg ibuprofen every 6 hours or 1000 mg of Tylenol every 6 hours.  You may choose to alternate between the 2.  This would be most effective.  Not to exceed 4 g of Tylenol within 24 hours.  Not to exceed 3200 mg ibuprofen 24 hours.

## 2020-05-09 ENCOUNTER — Other Ambulatory Visit: Payer: Self-pay

## 2020-05-09 ENCOUNTER — Encounter (HOSPITAL_COMMUNITY): Payer: Self-pay | Admitting: Emergency Medicine

## 2020-05-09 ENCOUNTER — Inpatient Hospital Stay (HOSPITAL_COMMUNITY)
Admission: EM | Admit: 2020-05-09 | Discharge: 2020-05-13 | DRG: 027 | Disposition: A | Discharge status: Home / Self Care | Payer: Self-pay | Attending: General Surgery | Admitting: General Surgery

## 2020-05-09 ENCOUNTER — Emergency Department (HOSPITAL_COMMUNITY): Payer: Self-pay

## 2020-05-09 DIAGNOSIS — S0193XA Puncture wound without foreign body of unspecified part of head, initial encounter: Secondary | ICD-10-CM

## 2020-05-09 DIAGNOSIS — S161XXA Strain of muscle, fascia and tendon at neck level, initial encounter: Secondary | ICD-10-CM | POA: Diagnosis present

## 2020-05-09 DIAGNOSIS — S065X9A Traumatic subdural hemorrhage with loss of consciousness of unspecified duration, initial encounter: Secondary | ICD-10-CM | POA: Diagnosis present

## 2020-05-09 DIAGNOSIS — Z23 Encounter for immunization: Secondary | ICD-10-CM

## 2020-05-09 DIAGNOSIS — S065XAA Traumatic subdural hemorrhage with loss of consciousness status unknown, initial encounter: Secondary | ICD-10-CM

## 2020-05-09 DIAGNOSIS — R402412 Glasgow coma scale score 13-15, at arrival to emergency department: Secondary | ICD-10-CM | POA: Diagnosis present

## 2020-05-09 DIAGNOSIS — Z20822 Contact with and (suspected) exposure to covid-19: Secondary | ICD-10-CM | POA: Diagnosis present

## 2020-05-09 DIAGNOSIS — J45909 Unspecified asthma, uncomplicated: Secondary | ICD-10-CM | POA: Diagnosis present

## 2020-05-09 DIAGNOSIS — F17210 Nicotine dependence, cigarettes, uncomplicated: Secondary | ICD-10-CM | POA: Diagnosis present

## 2020-05-09 DIAGNOSIS — W3400XA Accidental discharge from unspecified firearms or gun, initial encounter: Secondary | ICD-10-CM

## 2020-05-09 DIAGNOSIS — S020XXB Fracture of vault of skull, initial encounter for open fracture: Principal | ICD-10-CM | POA: Diagnosis present

## 2020-05-09 DIAGNOSIS — S0291XB Unspecified fracture of skull, initial encounter for open fracture: Secondary | ICD-10-CM | POA: Diagnosis present

## 2020-05-09 LAB — CBC WITH DIFFERENTIAL/PLATELET
Abs Immature Granulocytes: 0.01 10*3/uL (ref 0.00–0.07)
Basophils Absolute: 0.1 10*3/uL (ref 0.0–0.1)
Basophils Relative: 1 %
Eosinophils Absolute: 0.2 10*3/uL (ref 0.0–0.5)
Eosinophils Relative: 4 %
HCT: 44.6 % (ref 39.0–52.0)
Hemoglobin: 15.3 g/dL (ref 13.0–17.0)
Immature Granulocytes: 0 %
Lymphocytes Relative: 51 %
Lymphs Abs: 2.9 10*3/uL (ref 0.7–4.0)
MCH: 33.3 pg (ref 26.0–34.0)
MCHC: 34.3 g/dL (ref 30.0–36.0)
MCV: 97 fL (ref 80.0–100.0)
Monocytes Absolute: 0.6 10*3/uL (ref 0.1–1.0)
Monocytes Relative: 11 %
Neutro Abs: 1.9 10*3/uL (ref 1.7–7.7)
Neutrophils Relative %: 33 %
Platelets: 285 10*3/uL (ref 150–400)
RBC: 4.6 MIL/uL (ref 4.22–5.81)
RDW: 12.6 % (ref 11.5–15.5)
WBC: 5.7 10*3/uL (ref 4.0–10.5)
nRBC: 0 % (ref 0.0–0.2)

## 2020-05-09 LAB — COMPREHENSIVE METABOLIC PANEL
ALT: 17 U/L (ref 0–44)
AST: 25 U/L (ref 15–41)
Albumin: 4.5 g/dL (ref 3.5–5.0)
Alkaline Phosphatase: 73 U/L (ref 38–126)
Anion gap: 15 (ref 5–15)
BUN: 12 mg/dL (ref 6–20)
CO2: 23 mmol/L (ref 22–32)
Calcium: 9.4 mg/dL (ref 8.9–10.3)
Chloride: 100 mmol/L (ref 98–111)
Creatinine, Ser: 1.42 mg/dL — ABNORMAL HIGH (ref 0.61–1.24)
GFR calc Af Amer: >60 mL/min (ref >60)
GFR calc non Af Amer: >60 mL/min (ref >60)
Glucose, Bld: 109 mg/dL — ABNORMAL HIGH (ref 70–99)
Potassium: 3.6 mmol/L (ref 3.5–5.1)
Sodium: 138 mmol/L (ref 135–145)
Total Bilirubin: 0.3 mg/dL (ref 0.3–1.2)
Total Protein: 8 g/dL (ref 6.5–8.1)

## 2020-05-09 LAB — ETHANOL: Alcohol, Ethyl (B): <10 mg/dL (ref <10)

## 2020-05-09 LAB — TYPE AND SCREEN
ABO/RH(D): O NEG
Antibody Screen: NEGATIVE

## 2020-05-09 LAB — SARS CORONAVIRUS 2 BY RT PCR (HOSPITAL ORDER, PERFORMED IN ~~LOC~~ HOSPITAL LAB): SARS Coronavirus 2: NEGATIVE

## 2020-05-09 MED ORDER — ONDANSETRON HCL 4 MG/2ML IJ SOLN
INTRAMUSCULAR | Status: AC
  Administered 2020-05-09: 4 mg via INTRAVENOUS
  Filled 2020-05-09: qty 2

## 2020-05-09 MED ORDER — CLINDAMYCIN PHOSPHATE 900 MG/50ML IV SOLN
900.0000 mg | Freq: Once | INTRAVENOUS | Status: AC
  Administered 2020-05-09: 900 mg via INTRAVENOUS
  Filled 2020-05-09: qty 50

## 2020-05-09 MED ORDER — CEFAZOLIN SODIUM-DEXTROSE 2-4 GM/100ML-% IV SOLN
2.0000 g | Freq: Once | INTRAVENOUS | Status: AC
Start: 2020-05-09 — End: 2020-05-10
  Administered 2020-05-09: 2 g via INTRAVENOUS
  Filled 2020-05-09 (×2): qty 100

## 2020-05-09 MED ORDER — TETANUS-DIPHTH-ACELL PERTUSSIS 5-2.5-18.5 LF-MCG/0.5 IM SUSP
0.5000 mL | Freq: Once | INTRAMUSCULAR | Status: AC
  Administered 2020-05-09: 0.5 mL via INTRAMUSCULAR
  Filled 2020-05-09: qty 0.5

## 2020-05-09 MED ORDER — FENTANYL CITRATE (PF) 100 MCG/2ML IJ SOLN
50.0000 ug | Freq: Once | INTRAMUSCULAR | Status: AC
  Administered 2020-05-09: 50 ug via INTRAVENOUS
  Filled 2020-05-09: qty 2

## 2020-05-09 MED ORDER — ONDANSETRON HCL 4 MG/2ML IJ SOLN: 4.0000 mg | Freq: Four times a day (QID) | INTRAMUSCULAR | Status: DC | PRN

## 2020-05-09 NOTE — ED Notes (Signed)
[facility] at bedside 
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
## 2020-05-10 ENCOUNTER — Emergency Department (HOSPITAL_COMMUNITY): Payer: Self-pay | Admitting: Certified Registered"

## 2020-05-10 ENCOUNTER — Encounter (HOSPITAL_COMMUNITY): Payer: Self-pay | Admitting: Neurosurgery

## 2020-05-10 ENCOUNTER — Encounter (HOSPITAL_COMMUNITY): Admission: EM | Disposition: A | Discharge status: Home / Self Care | Payer: Self-pay | Source: Home / Self Care

## 2020-05-10 DIAGNOSIS — W3400XA Accidental discharge from unspecified firearms or gun, initial encounter: Secondary | ICD-10-CM

## 2020-05-10 DIAGNOSIS — S0291XB Unspecified fracture of skull, initial encounter for open fracture: Secondary | ICD-10-CM | POA: Diagnosis present

## 2020-05-10 HISTORY — PX: CRANIOTOMY: SHX93

## 2020-05-10 LAB — BASIC METABOLIC PANEL
Anion gap: 11 (ref 5–15)
BUN: 8 mg/dL (ref 6–20)
CO2: 22 mmol/L (ref 22–32)
Calcium: 9.3 mg/dL (ref 8.9–10.3)
Chloride: 104 mmol/L (ref 98–111)
Creatinine, Ser: 1.23 mg/dL (ref 0.61–1.24)
GFR calc Af Amer: >60 mL/min (ref >60)
GFR calc non Af Amer: >60 mL/min (ref >60)
Glucose, Bld: 134 mg/dL — ABNORMAL HIGH (ref 70–99)
Potassium: 4.3 mmol/L (ref 3.5–5.1)
Sodium: 137 mmol/L (ref 135–145)

## 2020-05-10 LAB — RAPID URINE DRUG SCREEN, HOSP PERFORMED
Amphetamines: NOT DETECTED
Barbiturates: NOT DETECTED
Benzodiazepines: POSITIVE — AB
Cocaine: POSITIVE — AB
Opiates: POSITIVE — AB
Tetrahydrocannabinol: POSITIVE — AB

## 2020-05-10 LAB — CBC
HCT: 43.3 % (ref 39.0–52.0)
Hemoglobin: 14.8 g/dL (ref 13.0–17.0)
MCH: 33.3 pg (ref 26.0–34.0)
MCHC: 34.2 g/dL (ref 30.0–36.0)
MCV: 97.5 fL (ref 80.0–100.0)
Platelets: 257 10*3/uL (ref 150–400)
RBC: 4.44 MIL/uL (ref 4.22–5.81)
RDW: 12.5 % (ref 11.5–15.5)
WBC: 11.4 10*3/uL — ABNORMAL HIGH (ref 4.0–10.5)
nRBC: 0 % (ref 0.0–0.2)

## 2020-05-10 LAB — MRSA PCR SCREENING: MRSA by PCR: NEGATIVE

## 2020-05-10 LAB — TYPE AND SCREEN
ABO/RH(D): O NEG
Antibody Screen: NEGATIVE

## 2020-05-10 LAB — HIV ANTIBODY (ROUTINE TESTING W REFLEX): HIV Screen 4th Generation wRfx: NONREACTIVE

## 2020-05-10 SURGERY — CRANIOTOMY HEMATOMA EVACUATION SUBDURAL
Anesthesia: General | Site: Head

## 2020-05-10 MED ORDER — PNEUMOCOCCAL VAC POLYVALENT 25 MCG/0.5ML IJ INJ
0.5000 mL | INJECTION | INTRAMUSCULAR | Status: AC
  Administered 2020-05-11: 0.5 mL via INTRAMUSCULAR
  Filled 2020-05-10: qty 0.5

## 2020-05-10 MED ORDER — FENTANYL CITRATE (PF) 250 MCG/5ML IJ SOLN
INTRAMUSCULAR | Status: AC
  Filled 2020-05-10: qty 5

## 2020-05-10 MED ORDER — OXYCODONE HCL 5 MG PO TABS
5.0000 mg | ORAL_TABLET | ORAL | Status: DC | PRN
  Administered 2020-05-10 – 2020-05-12 (×3): 10 mg via ORAL
  Administered 2020-05-12: 5 mg via ORAL
  Administered 2020-05-13: 10 mg via ORAL
  Filled 2020-05-10 (×5): qty 2
  Filled 2020-05-10: qty 1

## 2020-05-10 MED ORDER — ONDANSETRON 4 MG PO TBDP: 4.0000 mg | ORAL_TABLET | Freq: Four times a day (QID) | ORAL | Status: DC | PRN

## 2020-05-10 MED ORDER — ALBUTEROL SULFATE (2.5 MG/3ML) 0.083% IN NEBU: 3.0000 mL | INHALATION_SOLUTION | RESPIRATORY_TRACT | Status: DC | PRN

## 2020-05-10 MED ORDER — NALOXONE HCL 0.4 MG/ML IJ SOLN: 0.0800 mg | INTRAMUSCULAR | Status: DC | PRN

## 2020-05-10 MED ORDER — PROPOFOL 10 MG/ML IV BOLUS
INTRAVENOUS | Status: AC
  Filled 2020-05-10: qty 20

## 2020-05-10 MED ORDER — PROMETHAZINE HCL 25 MG PO TABS: 12.5000 mg | ORAL_TABLET | ORAL | Status: DC | PRN

## 2020-05-10 MED ORDER — PROCHLORPERAZINE EDISYLATE 10 MG/2ML IJ SOLN: 5.0000 mg | Freq: Four times a day (QID) | INTRAMUSCULAR | Status: DC | PRN

## 2020-05-10 MED ORDER — LIDOCAINE HCL 1 % IJ SOLN
INTRAMUSCULAR | Status: DC | PRN
Start: 2020-05-10 — End: 2020-05-10
  Administered 2020-05-10: 50 mg via INTRADERMAL

## 2020-05-10 MED ORDER — ROCURONIUM 10MG/ML (10ML) SYRINGE FOR MEDFUSION PUMP - OPTIME
INTRAVENOUS | Status: DC | PRN
  Administered 2020-05-10: 50 mg via INTRAVENOUS

## 2020-05-10 MED ORDER — HYDROMORPHONE HCL 1 MG/ML IJ SOLN: 0.2500 mg | INTRAMUSCULAR | Status: DC | PRN

## 2020-05-10 MED ORDER — ONDANSETRON HCL 4 MG/2ML IJ SOLN
4.0000 mg | INTRAMUSCULAR | Status: DC | PRN
Start: 2020-05-10 — End: 2020-05-10

## 2020-05-10 MED ORDER — LEVETIRACETAM IN NACL 500 MG/100ML IV SOLN
500.0000 mg | Freq: Two times a day (BID) | INTRAVENOUS | Status: DC
  Administered 2020-05-10 – 2020-05-12 (×5): 500 mg via INTRAVENOUS
  Filled 2020-05-10 (×5): qty 100

## 2020-05-10 MED ORDER — SODIUM CHLORIDE 0.9 % IV SOLN
INTRAVENOUS | Status: DC | PRN
Start: 2020-05-10 — End: 2020-05-10

## 2020-05-10 MED ORDER — LABETALOL HCL 5 MG/ML IV SOLN: 10.0000 mg | INTRAVENOUS | Status: DC | PRN

## 2020-05-10 MED ORDER — PHENYLEPHRINE HCL (PRESSORS) 10 MG/ML IV SOLN
INTRAVENOUS | Status: DC | PRN
Start: 2020-05-10 — End: 2020-05-10
  Administered 2020-05-10 (×2): 40 ug via INTRAVENOUS

## 2020-05-10 MED ORDER — ACETAMINOPHEN 325 MG PO TABS: 325.0000 mg | ORAL_TABLET | Freq: Once | ORAL | Status: DC | PRN

## 2020-05-10 MED ORDER — PROPOFOL 10 MG/ML IV BOLUS
INTRAVENOUS | Status: DC | PRN
  Administered 2020-05-10: 150 mg via INTRAVENOUS

## 2020-05-10 MED ORDER — MORPHINE SULFATE (PF) 2 MG/ML IV SOLN: 1.0000 mg | INTRAVENOUS | Status: DC | PRN

## 2020-05-10 MED ORDER — PROMETHAZINE HCL 25 MG/ML IJ SOLN: 6.2500 mg | INTRAMUSCULAR | Status: DC | PRN

## 2020-05-10 MED ORDER — CHLORHEXIDINE GLUCONATE CLOTH 2 % EX PADS
6.0000 | MEDICATED_PAD | Freq: Every day | CUTANEOUS | Status: DC
  Administered 2020-05-10 – 2020-05-13 (×5): 6 via TOPICAL

## 2020-05-10 MED ORDER — THROMBIN 20000 UNITS EX SOLR
CUTANEOUS | Status: DC | PRN
  Administered 2020-05-10: 20 mL via TOPICAL

## 2020-05-10 MED ORDER — MEPERIDINE HCL 25 MG/ML IJ SOLN: 6.2500 mg | INTRAMUSCULAR | Status: DC | PRN

## 2020-05-10 MED ORDER — ONDANSETRON HCL 4 MG/2ML IJ SOLN: 4.0000 mg | Freq: Four times a day (QID) | INTRAMUSCULAR | Status: DC | PRN

## 2020-05-10 MED ORDER — ACETAMINOPHEN 325 MG PO TABS
650.0000 mg | ORAL_TABLET | ORAL | Status: DC | PRN
  Administered 2020-05-10 – 2020-05-11 (×2): 650 mg via ORAL
  Filled 2020-05-10 (×2): qty 2

## 2020-05-10 MED ORDER — ACETAMINOPHEN 10 MG/ML IV SOLN: 1000.0000 mg | Freq: Once | INTRAVENOUS | Status: DC | PRN

## 2020-05-10 MED ORDER — LIDOCAINE-EPINEPHRINE 0.5 %-1:200000 IJ SOLN
INTRAMUSCULAR | Status: AC
  Filled 2020-05-10: qty 1

## 2020-05-10 MED ORDER — LIDOCAINE-EPINEPHRINE 0.5 %-1:200000 IJ SOLN
INTRAMUSCULAR | Status: DC | PRN
  Administered 2020-05-10: 8 mL via INTRADERMAL

## 2020-05-10 MED ORDER — BACITRACIN ZINC 500 UNIT/GM EX OINT
TOPICAL_OINTMENT | CUTANEOUS | Status: DC | PRN
  Administered 2020-05-10: 1 via TOPICAL

## 2020-05-10 MED ORDER — KCL IN DEXTROSE-NACL 20-5-0.45 MEQ/L-%-% IV SOLN
INTRAVENOUS | Status: AC
  Filled 2020-05-10: qty 1000

## 2020-05-10 MED ORDER — METHOCARBAMOL 750 MG PO TABS
750.0000 mg | ORAL_TABLET | Freq: Three times a day (TID) | ORAL | Status: DC | PRN
  Administered 2020-05-10 – 2020-05-13 (×3): 750 mg via ORAL
  Filled 2020-05-10: qty 1
  Filled 2020-05-10: qty 2
  Filled 2020-05-10: qty 1

## 2020-05-10 MED ORDER — ONDANSETRON HCL 4 MG PO TABS: 4.0000 mg | ORAL_TABLET | ORAL | Status: DC | PRN

## 2020-05-10 MED ORDER — HEPARIN SODIUM (PORCINE) 5000 UNIT/ML IJ SOLN
5000.0000 [IU] | Freq: Three times a day (TID) | INTRAMUSCULAR | Status: DC
  Filled 2020-05-10: qty 1

## 2020-05-10 MED ORDER — SUCCINYLCHOLINE 20MG/ML (10ML) SYRINGE FOR MEDFUSION PUMP - OPTIME
INTRAMUSCULAR | Status: DC | PRN
  Administered 2020-05-10: 140 mg via INTRAVENOUS

## 2020-05-10 MED ORDER — FENTANYL CITRATE (PF) 250 MCG/5ML IJ SOLN
INTRAMUSCULAR | Status: DC | PRN
  Administered 2020-05-10: 150 ug via INTRAVENOUS
  Administered 2020-05-10 (×2): 50 ug via INTRAVENOUS

## 2020-05-10 MED ORDER — 0.9 % SODIUM CHLORIDE (POUR BTL) OPTIME
TOPICAL | Status: DC | PRN
  Administered 2020-05-10 (×2): 1000 mL

## 2020-05-10 MED ORDER — HEPARIN SODIUM (PORCINE) 5000 UNIT/ML IJ SOLN
5000.0000 [IU] | Freq: Three times a day (TID) | INTRAMUSCULAR | Status: DC
  Administered 2020-05-11 – 2020-05-13 (×6): 5000 [IU] via SUBCUTANEOUS
  Filled 2020-05-10 (×8): qty 1

## 2020-05-10 MED ORDER — SODIUM CHLORIDE 0.9 % IV SOLN: INTRAVENOUS | Status: DC | PRN

## 2020-05-10 MED ORDER — POTASSIUM CHLORIDE IN NACL 20-0.9 MEQ/L-% IV SOLN: INTRAVENOUS | Status: DC

## 2020-05-10 MED ORDER — MORPHINE SULFATE (PF) 2 MG/ML IV SOLN
1.0000 mg | INTRAVENOUS | Status: DC | PRN
  Administered 2020-05-10: 1 mg via INTRAVENOUS
  Filled 2020-05-10: qty 1

## 2020-05-10 MED ORDER — ONDANSETRON HCL 4 MG/2ML IJ SOLN
INTRAMUSCULAR | Status: AC
  Filled 2020-05-10: qty 2

## 2020-05-10 MED ORDER — ALBUTEROL SULFATE HFA 108 (90 BASE) MCG/ACT IN AERS: 2.0000 | INHALATION_SPRAY | Freq: Four times a day (QID) | RESPIRATORY_TRACT | Status: DC | PRN

## 2020-05-10 MED ORDER — PROCHLORPERAZINE MALEATE 10 MG PO TABS
10.0000 mg | ORAL_TABLET | Freq: Four times a day (QID) | ORAL | Status: DC | PRN
  Filled 2020-05-10: qty 1

## 2020-05-10 MED ORDER — FENTANYL CITRATE (PF) 100 MCG/2ML IJ SOLN
50.0000 ug | Freq: Once | INTRAMUSCULAR | Status: AC
  Administered 2020-05-10: 50 ug via INTRAVENOUS
  Filled 2020-05-10: qty 2

## 2020-05-10 MED ORDER — BACITRACIN ZINC 500 UNIT/GM EX OINT
TOPICAL_OINTMENT | CUTANEOUS | Status: AC
  Filled 2020-05-10: qty 28.35

## 2020-05-10 MED ORDER — ACETAMINOPHEN 160 MG/5ML PO SOLN: 325.0000 mg | Freq: Once | ORAL | Status: DC | PRN

## 2020-05-10 MED ORDER — DEXMEDETOMIDINE HCL 200 MCG/2ML IV SOLN
INTRAVENOUS | Status: DC | PRN
  Administered 2020-05-10: 8 ug via INTRAVENOUS
  Administered 2020-05-10: 4 ug via INTRAVENOUS
  Administered 2020-05-10: 8 ug via INTRAVENOUS

## 2020-05-10 MED ORDER — THROMBIN 20000 UNITS EX SOLR
CUTANEOUS | Status: AC
  Filled 2020-05-10: qty 20000

## 2020-05-10 MED ORDER — HYDROCODONE-ACETAMINOPHEN 5-325 MG PO TABS: 1.0000 | ORAL_TABLET | ORAL | Status: DC | PRN

## 2020-05-10 MED ORDER — ONDANSETRON HCL 4 MG/2ML IJ SOLN
INTRAMUSCULAR | Status: DC | PRN
Start: 2020-05-10 — End: 2020-05-10
  Administered 2020-05-10: 4 mg via INTRAVENOUS

## 2020-05-10 MED ORDER — LEVETIRACETAM IN NACL 1000 MG/100ML IV SOLN
1000.0000 mg | INTRAVENOUS | Status: AC
  Administered 2020-05-10: 1000 mg via INTRAVENOUS
  Filled 2020-05-10: qty 100

## 2020-05-10 MED ORDER — LACTATED RINGERS IV SOLN: INTRAVENOUS | Status: DC

## 2020-05-10 MED ORDER — DEXAMETHASONE SODIUM PHOSPHATE 10 MG/ML IJ SOLN
INTRAMUSCULAR | Status: DC | PRN
Start: 2020-05-10 — End: 2020-05-10
  Administered 2020-05-10: 10 mg via INTRAVENOUS

## 2020-05-10 MED ORDER — DEXAMETHASONE SODIUM PHOSPHATE 10 MG/ML IJ SOLN
INTRAMUSCULAR | Status: AC
  Filled 2020-05-10: qty 1

## 2020-05-10 MED ORDER — MIDAZOLAM HCL 2 MG/2ML IJ SOLN
INTRAMUSCULAR | Status: DC | PRN
  Administered 2020-05-10: 2 mg via INTRAVENOUS

## 2020-05-10 MED ORDER — DOCUSATE SODIUM 100 MG PO CAPS
100.0000 mg | ORAL_CAPSULE | Freq: Two times a day (BID) | ORAL | Status: DC
  Administered 2020-05-10 – 2020-05-13 (×7): 100 mg via ORAL
  Filled 2020-05-10 (×7): qty 1

## 2020-05-10 MED ORDER — SUGAMMADEX SODIUM 200 MG/2ML IV SOLN
INTRAVENOUS | Status: DC | PRN
  Administered 2020-05-10: 200 mg via INTRAVENOUS

## 2020-05-10 MED ORDER — PANTOPRAZOLE SODIUM 40 MG IV SOLR
40.0000 mg | Freq: Every day | INTRAVENOUS | Status: DC
  Administered 2020-05-10: 40 mg via INTRAVENOUS
  Filled 2020-05-10: qty 40

## 2020-05-10 MED ORDER — MIDAZOLAM HCL 2 MG/2ML IJ SOLN
INTRAMUSCULAR | Status: AC
  Filled 2020-05-10: qty 2

## 2020-05-10 SURGICAL SUPPLY — 64 items
BENZOIN TINCTURE PRP APPL 2/3 (GAUZE/BANDAGES/DRESSINGS) IMPLANT
BLADE CLIPPER SURG (BLADE) ×2 IMPLANT
BLADE ULTRA TIP 2M (BLADE) IMPLANT
BNDG GAUZE ELAST 4 BULKY (GAUZE/BANDAGES/DRESSINGS) IMPLANT
BUR ACORN 6.0 PRECISION (BURR) IMPLANT
BUR MATCHSTICK NEURO 3.0 LAGG (BURR) IMPLANT
BUR SPIRAL ROUTER 2.3 (BUR) IMPLANT
CANISTER SUCT 3000ML PPV (MISCELLANEOUS) ×2 IMPLANT
CARTRIDGE OIL MAESTRO DRILL (MISCELLANEOUS) ×1 IMPLANT
CLIP VESOCCLUDE MED 6/CT (CLIP) IMPLANT
CNTNR URN SCR LID CUP LEK RST (MISCELLANEOUS) ×1 IMPLANT
CONT SPEC 4OZ STRL OR WHT (MISCELLANEOUS) ×2
COVER WAND RF STERILE (DRAPES) ×2 IMPLANT
DIFFUSER DRILL AIR PNEUMATIC (MISCELLANEOUS) ×2 IMPLANT
DRAPE NEUROLOGICAL W/INCISE (DRAPES) ×2 IMPLANT
DRAPE SURG 17X23 STRL (DRAPES) IMPLANT
DRAPE WARM FLUID 44X44 (DRAPES) ×2 IMPLANT
DRSG TELFA 3X8 NADH (GAUZE/BANDAGES/DRESSINGS) ×2 IMPLANT
DURAPREP 6ML APPLICATOR 50/CS (WOUND CARE) ×2 IMPLANT
ELECT REM PT RETURN 9FT ADLT (ELECTROSURGICAL) ×2
ELECTRODE REM PT RTRN 9FT ADLT (ELECTROSURGICAL) ×1 IMPLANT
EVACUATOR 1/8 PVC DRAIN (DRAIN) IMPLANT
EVACUATOR SILICONE 100CC (DRAIN) IMPLANT
GAUZE 4X4 16PLY RFD (DISPOSABLE) IMPLANT
GAUZE SPONGE 4X4 12PLY STRL (GAUZE/BANDAGES/DRESSINGS) ×2 IMPLANT
GLOVE BIO SURGEON STRL SZ 6.5 (GLOVE) ×2 IMPLANT
GLOVE BIO SURGEON STRL SZ7 (GLOVE) ×2 IMPLANT
GLOVE ECLIPSE 6.5 STRL STRAW (GLOVE) ×2 IMPLANT
GLOVE ECLIPSE 7.0 STRL STRAW (GLOVE) ×2 IMPLANT
GLOVE EXAM NITRILE XL STR (GLOVE) IMPLANT
GOWN STRL REUS W/ TWL LRG LVL3 (GOWN DISPOSABLE) ×2 IMPLANT
GOWN STRL REUS W/ TWL XL LVL3 (GOWN DISPOSABLE) IMPLANT
GOWN STRL REUS W/TWL 2XL LVL3 (GOWN DISPOSABLE) IMPLANT
GOWN STRL REUS W/TWL LRG LVL3 (GOWN DISPOSABLE) ×4
GOWN STRL REUS W/TWL XL LVL3 (GOWN DISPOSABLE)
GRAFT DURAGEN MATRIX 1WX1L (Tissue) ×2 IMPLANT
HEMOSTAT SURGICEL 2X14 (HEMOSTASIS) IMPLANT
KIT BASIN OR (CUSTOM PROCEDURE TRAY) ×2 IMPLANT
KIT TURNOVER KIT B (KITS) ×2 IMPLANT
MESH DYNAMIC MALL MED 1.5 (Mesh General) ×2 IMPLANT
NEEDLE HYPO 25X1 1.5 SAFETY (NEEDLE) ×2 IMPLANT
NS IRRIG 1000ML POUR BTL (IV SOLUTION) ×4 IMPLANT
OIL CARTRIDGE MAESTRO DRILL (MISCELLANEOUS) ×2
PACK BATTERY CMF DISP FOR DVR (ORTHOPEDIC DISPOSABLE SUPPLIES) ×2 IMPLANT
PACK CRANIOTOMY CUSTOM (CUSTOM PROCEDURE TRAY) ×2 IMPLANT
PATTIES SURGICAL .5 X.5 (GAUZE/BANDAGES/DRESSINGS) IMPLANT
PATTIES SURGICAL .5 X3 (DISPOSABLE) IMPLANT
PATTIES SURGICAL 1X1 (DISPOSABLE) IMPLANT
SCREW UNIII AXS SD 1.5X4 (Screw) ×12 IMPLANT
SPONGE NEURO XRAY DETECT 1X3 (DISPOSABLE) IMPLANT
SPONGE SURGIFOAM ABS GEL 100 (HEMOSTASIS) ×2 IMPLANT
STAPLER VISISTAT 35W (STAPLE) ×2 IMPLANT
SUT ETHILON 3 0 FSL (SUTURE) IMPLANT
SUT ETHILON 3 0 PS 1 (SUTURE) IMPLANT
SUT NURALON 4 0 TR CR/8 (SUTURE) ×6 IMPLANT
SUT STEEL 0 (SUTURE)
SUT STEEL 0 18XMFL TIE 17 (SUTURE) IMPLANT
SUT VIC AB 2-0 CT2 18 VCP726D (SUTURE) ×4 IMPLANT
TOWEL GREEN STERILE (TOWEL DISPOSABLE) ×2 IMPLANT
TOWEL GREEN STERILE FF (TOWEL DISPOSABLE) ×2 IMPLANT
TRAY FOLEY MTR SLVR 16FR STAT (SET/KITS/TRAYS/PACK) ×2 IMPLANT
TUBE CONNECTING 12X1/4 (SUCTIONS) ×2 IMPLANT
UNDERPAD 30X36 HEAVY ABSORB (UNDERPADS AND DIAPERS) ×2 IMPLANT
WATER STERILE IRR 1000ML POUR (IV SOLUTION) ×2 IMPLANT
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
## 2020-05-10 NOTE — Anesthesia Procedure Notes (Signed)
Procedure Name: Intubation Date/Time: 05/10/2020 1:15 AM Performed by: Molli Hazard, CRNA Pre-anesthesia Checklist: Patient identified, Emergency Drugs available, Suction available and Patient being monitored Patient Re-evaluated:Patient Re-evaluated prior to induction Oxygen Delivery Method: Circle system utilized Preoxygenation: Pre-oxygenation with 100% oxygen Induction Type: IV induction and Inhalational induction Laryngoscope Size: Miller and 2 Grade View: Grade I Tube type: Oral Tube size: 7.5 mm Number of attempts: 1 Airway Equipment and Method: Stylet Placement Confirmation: ETT inserted through vocal cords under direct vision,  positive ETCO2 and breath sounds checked- equal and bilateral Secured at: 24 cm Tube secured with: Tape Dental Injury: Teeth and Oropharynx as per pre-operative assessment

## 2020-05-10 NOTE — Progress Notes (Signed)
OT Cancellation Note  Patient Details Name: Christian Salazar MRN: [id number] DOB: 1990/11/12   Cancelled Treatment:    Reason Eval/Treat Not Completed: Active bedrest order  [facility] [place], OT/L   Acute OT Clinical Specialist Acute Rehabilitation Services Pager [phone] Office [phone]  05/10/2020, 7:33 AM
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
## 2020-05-11 MED ORDER — PANTOPRAZOLE SODIUM 40 MG PO TBEC
40.0000 mg | DELAYED_RELEASE_TABLET | Freq: Every day | ORAL | Status: DC
  Administered 2020-05-11 – 2020-05-12 (×2): 40 mg via ORAL
  Filled 2020-05-11 (×2): qty 1
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
## 2020-05-11 NOTE — TOC CAGE-AID Note (Signed)
Transition of Care [facility]) - CAGE-AID Screening   Patient Details  Name: MAHMOOD BOEHRINGER MRN: [id number] Date of Birth: 04/07/1991  Transition of [facility]) CM/SW Contact:    Emeterio Reeve, [place] Phone Number: 05/11/2020, 3:08 PM   Clinical Narrative:  CSW met with pt at bedside. CSW introduced self and explained her role at the hospital. Pts significant other was in the room and stated she can stay for assessment.  Pt reports social alcohol use. Pt reports social substance use. CSW informed pt that he was positive for Benzos, op, coc, thc. Pt states he was wasn't sure what he had used. CSW offered counseling and education, pt declined. Pt declined resources.   CAGE-AID Screening:    Have You Ever Felt You Ought to Cut Down on Your Drinking or Drug Use?: No Have People Annoyed You By Critizing Your Drinking Or Drug Use?: No Have You Felt Bad Or Guilty About Your Drinking Or Drug Use?: No Have You Ever Had a Drink or Used Drugs First Thing In The Morning to Steady Your Nerves or to Get Rid of a Hangover?: No CAGE-AID Score: 0  Substance Abuse Education Offered: Yes  Substance abuse interventions: Patient Counseling  Emeterio Reeve, Latanya Presser, [place] Social Worker [phone]
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
## 2020-05-11 NOTE — Progress Notes (Signed)
NEUROSURGERY PROGRESS NOTE Postop day 1 crani for open depressed skull fracture.  Doing well. Denies any HA, NV or vision changes.  No numbness, tingling or weakness Ambulating and voiding well Good strength and sensation Incision CDI  Temp:  [98.3 F (36.8 C)-99.4 F (37.4 C)] 98.7 F (37.1 C) (07/17 0810) Pulse Rate:  [58-69] 61 (07/17 0810) Resp:  [13-21] 13 (07/17 0810) BP: (100-127)/(64-96) 127/96 (07/17 0810) SpO2:  [87 %-100 %] 100 % (07/17 0810)  Plan: Therapies today. Will plan for discharge tomorrow if no acute changes.   Sherryl Manges, NP 05/11/2020 8:39 AM
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
## 2020-05-11 NOTE — Progress Notes (Addendum)
Pt refused heparin injection sq, stated "it made my stomach hurt." RN provided education on medication post surgical procedure verbalized understanding but still refused, stated he will wear SCDs.

## 2020-05-12 MED ORDER — LEVETIRACETAM 500 MG PO TABS
500.0000 mg | ORAL_TABLET | Freq: Two times a day (BID) | ORAL | Status: DC
  Administered 2020-05-12 – 2020-05-13 (×2): 500 mg via ORAL
  Filled 2020-05-12 (×2): qty 1
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
## 2020-05-12 NOTE — Progress Notes (Signed)
NEUROSURGERY PROGRESS NOTE  Doing well. Complains of some headaches. Not very talkative or interactive. Difficult to get information out of patient. States that he didn't get up very much yesterday because he got light headed.  Incision CDI  Temp:  [98.1 F (36.7 C)-99.1 F (37.3 C)] 98.1 F (36.7 C) (07/18 0817) Pulse Rate:  [56-82] 65 (07/18 0433) Resp:  [13-27] 17 (07/18 0529) BP: (100-128)/(60-76) 127/70 (07/18 0817) SpO2:  [98 %-100 %] 98 % (07/18 0433)  Plan: Would like him to ambulate more today to make sure he is steady for discharge. Continue therapies. Patient is very adamant on speaking to a Child psychotherapist and will not disclose why.  Sherryl Manges, NP 05/12/2020 9:57 AM

## 2020-05-12 NOTE — TOC Progression Note (Signed)
Transition of Care [facility]) - Progression Note    Patient Details  Name: Christian Salazar MRN: [id number] Date of Birth: [birth year]  [facility]) CM/SW Contact  Annalee Genta, LCSW Phone Number: 05/12/2020, 11:39 AM  Clinical Narrative: CSW contacted by RN regarding patient requesting to speak with the social work team. CSW spoke with patient and noted patient was asking for assistance applying for SSI and Medicaid. CSW informed patient of the process and provided education/resources on the process to him and his visitor. CSW noted patient did request assistance for applying and CSW reached out to financial counseling department for follow-up.   Expected Discharge Plan: OP Rehab Barriers to Discharge: Continued Medical Work up  Expected Discharge Plan and Services Expected Discharge Plan: OP Rehab   Discharge Planning Services: CM Consult   Living arrangements for the past 2 months: Single Family Home                                       Social Determinants of Health (SDOH) Interventions    Readmission Risk Interventions No flowsheet data found.
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
## 2020-05-12 NOTE — Progress Notes (Signed)
PHARMACIST - PHYSICIAN COMMUNICATION  DR:   Verlin Dike  CONCERNING: IV to Oral Route Change Policy  RECOMMENDATION: This patient is receiving Keppra by the intravenous route.  Based on criteria approved by the Pharmacy and Therapeutics Committee, the intravenous medication(s) is/are being converted to the equivalent oral dose form(s).   DESCRIPTION: These criteria include:  The patient is eating (either orally or via tube) and/or has been taking other orally administered medications for a least 24 hours  The patient has no evidence of active gastrointestinal bleeding or impaired GI absorption (gastrectomy, short bowel, patient on TNA or NPO).  If you have questions about this conversion, please contact the Pharmacy Department  []   [phone] )  [phone] []   [phone] )  [facility] [x]   [phone] )  [facility] []   [phone] )  [facility] []   [phone] )    ( [phone], PharmD PGY-1 Acute Care Pharmacy Resident Office: [phone] 05/12/2020 1:54 PM

## 2020-05-13 ENCOUNTER — Encounter (HOSPITAL_COMMUNITY): Payer: Self-pay | Admitting: Neurosurgery

## 2020-05-13 MED ORDER — OXYCODONE HCL 5 MG PO TABS: 5.0000 mg | ORAL_TABLET | Freq: Four times a day (QID) | ORAL | 0 refills | Status: DC | PRN

## 2020-05-13 MED ORDER — LEVETIRACETAM 500 MG PO TABS: 500.0000 mg | ORAL_TABLET | Freq: Two times a day (BID) | ORAL | 0 refills | Status: AC

## 2020-05-13 MED ORDER — ACETAMINOPHEN 500 MG PO TABS: 1000.0000 mg | ORAL_TABLET | Freq: Three times a day (TID) | ORAL | Status: DC | PRN

## 2020-05-13 MED FILL — oxyCODONE HCL 5 MG TABS: 5 | 3 days supply | Qty: 15 | Fill #0

## 2020-05-13 MED FILL — levETIRAcetam 500 MG TABS: 500 | 7 days supply | Qty: 14 | Fill #0
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
## 2020-05-13 NOTE — Discharge Instructions (Signed)
Craniotomy  Craniotomy is a surgical procedure in which part of the skull is temporarily removed (bone flap) to access the brain for surgery. The amount of skull that needs to be removed depends on the type of surgery that will be done. The bone flap is put back in place after the surgery. It is usually fastened back in place with metal plates and screws. The plates and screws can often be removed later after healing. Brain surgery that involves craniotomy may be done for many reasons, including:  Cancer.  Removal of a collection of blood (hematoma).  Active bleeding inside the skull.  Removal of tumors or growths.  Swelling of the brain.  Infections or abscesses.  Placing deep brain stimulators on the brain for the treatment of Parkinson disease, epilepsy, or cerebellar tremors.  Traumatic brain injury (TBI). Tell a health care provider about:  All medicines you are taking, including vitamins, herbs, eye drops, creams, and over-the-counter medicines.  Any problems you or family members have had with anesthetic medicines.  Any allergies you have.  Any blood disorders you have.  Any surgeries you have had.  Any medical conditions you have.  Whether you are pregnant or may be pregnant. What are the risks? Generally, this is a safe procedure. However, problems may occur, including:  Bleeding.  Infection.  Brain damage or swelling.  Injury to facial muscles or to sinuses.  Seizures from brain irritability.  Injury from the pins.  Allergic reactions to medicines or dyes.  Damage to other structures or organs. What happens before the procedure? Staying hydrated Follow instructions from your health care provider about hydration, which may include:  Up to 2 hours before the procedure - you may continue to drink clear liquids, such as water, clear fruit juice, black coffee, and plain tea. Eating and drinking restrictions Follow instructions from your health care  provider about eating and drinking, which may include:  8 hours before the procedure - stop eating heavy meals or foods such as meat, fried foods, or fatty foods.  6 hours before the procedure - stop eating light meals or foods, such as toast or cereal.  6 hours before the procedure - stop drinking milk or drinks that contain milk.  2 hours before the procedure - stop drinking clear liquids. Medicines  Ask your health care provider about: ? Changing or stopping your regular medicines. This is especially important if you are taking diabetes medicines or blood thinners. ? Taking medicines such as aspirin and ibuprofen. These medicines can thin your blood. Do not take these medicines before your procedure if your health care provider instructs you not to.  You may be given antibiotic medicine to help prevent infection. General instructions  Ask your health care provider how your surgical site will be marked or identified.  You may be asked to shower and wash your hair with a germ-killing soap.  Plan to have someone take you home from the hospital or clinic. Also, arrange to have someone help you with activities during recovery. What happens during the procedure?  To lower your risk of infection: ? Your health care team will wash or sanitize their hands. ? Your skin will be washed with soap.  The site that is chosen for the craniotomy will be prepped (prepared). Hair will be removed from your scalp in this area.  An IV tube will be inserted into one of your veins.  You will be given one or more of the following: ? A medicine  to help you relax (sedative). ? A medicine to make you fall asleep (general anesthetic).  Your head will be held in place with a device. Pins will go into your skull so your head cannot move.  A flap will be cut in the scalp and small holes (burr holes) will be drilled in your skull.  A bone saw will be used to connect the burr holes and to cut out the bone  flap.  After the bone flap is removed, work will be done on the brain.  A drain may be placed inside your head to remove blood or fluids that might collect after surgery.  The bone will be put back in place and attached using plates, wires, or stitches (sutures).  Your scalp will be closed with sutures or staples.  A bandage (dressing) will be placed where your scalp was closed. The procedure may vary among health care providers and hospitals. What happens after the procedure?  Your blood pressure, heart rate, breathing rate, and blood oxygen level will be monitored until the medicines you were given have worn off.  You may be given a helmet to wear. Summary  Craniotomy is a surgical procedure in which part of the skull is temporarily removed (bone flap) to access the brain for surgery.  Ask your health care provider about changing or stopping your regular medicines before surgery.  The bone flap is put back in place after the surgery. It is usually fastened back in place with metal plates and screws. This information is not intended to replace advice given to you by your health care provider. Make sure you discuss any questions you have with your health care provider. Document Revised: 09/24/2017 Document Reviewed: 10/21/2016 Elsevier Patient Education  2020 [employer]   Craniotomy, Care After This sheet gives you information about how to care for yourself after your procedure. Your doctor may also give you more specific instructions. If you have problems or questions, contact your doctor. Follow these instructions at home: Wound care   Follow instructions from your doctor about how to take care of your cut from surgery (incision) and pin insertion areas. Make sure you: ? Wash your hands with soap and water before you change your bandage (dressing). If you cannot use soap and water, use hand sanitizer. ? Change your bandage as told by your doctor. ? Leave stitches  (sutures), skin glue, or skin tape (adhesive) strips in place. They may need to stay in place for 2 weeks or longer. If tape strips get loose and curl up, you may trim the loose edges. Do not remove tape strips completely unless your doctor says it is okay.  Check your cut area and pin insertion sites every day for signs of infection. Check for: ? Redness, swelling, or pain. ? Fluid or blood. ? Warmth. ? Pus or a bad smell.  Do not take baths, swim, or use a hot tub until your doctor says it is okay. Activity  Limit your activities as told by your doctor. You may then slowly increase your activities as told by your doctor. Avoid contact sports for 1 year or as told by your doctor.  Do not drive until your doctor says it is okay.  Rest and sleep as much as possible. When you lie down, keep your head raised (elevated) at a 30-degree angle. You can do this with some pillows. This helps keep brain swelling down and prevents increased pressure in the head. Ask your doctor when you  can go back to sleeping flat.  Ask your doctor when you can go back to work. General instructions   Wear a helmet as told by your doctor.  Do not drink alcohol.  Take over-the-counter and prescription medicines only as told by your doctor.  To prevent or treat constipation while you are taking prescription pain medicine, your doctor may suggest that you: ? Drink enough fluid to keep your pee (urine) clear or pale yellow. ? Take over-the-counter or prescription medicines. ? Eat foods that are high in fiber. Such foods include:  Fresh fruits.  Fresh vegetables.  Whole grains.  Beans. ? Limit foods that are high in fat and processed sugars, such as fried and sweet foods. Contact a doctor if:  You have redness, swelling, or pain around your cut area or pin insertion areas.  You have fluid or blood coming from your cut area or pin insertion areas.  Your cut area or pin insertion areas feel warm to the  touch.  You have pus or a bad smell coming from your cut area or pin insertion areas.  You have a fever. Get help right away if:  You have a very bad headache.  You have uncontrolled shaking or jerking movements (seizures).  You feel confused.  You pass out (faint).  You feel sick to your stomach (nausea) and you throw up (vomit) and it does not stop.  You feel weak or numb on one side of your body.  Your speech is slurred.  You have chest pain, a stiff neck, or trouble breathing.  You have blurry vision or loss of vision.  You have swelling or bruising around the eyes.  Your wound breaks open after the stitches or staples are taken out.  You have a rash.  You feel dizzy. Summary  Check your cut area and pin insertion areas every day for signs of infection. Signs of infection include redness, swelling, drainage, or fever.  After the procedure, return to activities slowly. Rest when you get tired.  Eat foods that are high in fiber, such as fresh fruits and vegetables, whole grains, and beans. Drink plenty of fluids to avoid constipation after surgery, especially if you are taking pain medicines. This information is not intended to replace advice given to you by your health care provider. Make sure you discuss any questions you have with your health care provider. Document Revised: 09/24/2017 Document Reviewed: 10/30/2016 Elsevier Patient Education  2020 [employer].
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
## 2020-05-13 NOTE — Plan of Care (Signed)
Patient will be discharged with mom
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
## 2020-06-06 ENCOUNTER — Other Ambulatory Visit: Payer: Self-pay

## 2020-06-06 ENCOUNTER — Ambulatory Visit: Payer: Self-pay | Attending: Family Medicine | Admitting: Family Medicine

## 2020-06-06 DIAGNOSIS — S0291XB Unspecified fracture of skull, initial encounter for open fracture: Secondary | ICD-10-CM
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
## 2020-06-06 NOTE — Progress Notes (Signed)
Patient needs refills on medications. 

## 2020-09-05 ENCOUNTER — Encounter (HOSPITAL_COMMUNITY): Payer: Self-pay

## 2020-09-05 ENCOUNTER — Emergency Department (HOSPITAL_COMMUNITY): Payer: Self-pay

## 2020-09-05 ENCOUNTER — Emergency Department (HOSPITAL_COMMUNITY)
Admission: EM | Admit: 2020-09-05 | Discharge: 2020-09-05 | Disposition: A | Discharge status: Home / Self Care | Payer: Self-pay | Attending: Emergency Medicine | Admitting: Emergency Medicine

## 2020-09-05 DIAGNOSIS — J45909 Unspecified asthma, uncomplicated: Secondary | ICD-10-CM | POA: Insufficient documentation

## 2020-09-05 DIAGNOSIS — S21212A Laceration without foreign body of left back wall of thorax without penetration into thoracic cavity, initial encounter: Secondary | ICD-10-CM | POA: Insufficient documentation

## 2020-09-05 DIAGNOSIS — F17210 Nicotine dependence, cigarettes, uncomplicated: Secondary | ICD-10-CM | POA: Insufficient documentation

## 2020-09-05 LAB — CBC WITH DIFFERENTIAL/PLATELET
Abs Immature Granulocytes: 0.02 10*3/uL (ref 0.00–0.07)
Basophils Absolute: 0 10*3/uL (ref 0.0–0.1)
Basophils Relative: 1 %
Eosinophils Absolute: 0.1 10*3/uL (ref 0.0–0.5)
Eosinophils Relative: 1 %
HCT: 42.1 % (ref 39.0–52.0)
Hemoglobin: 14 g/dL (ref 13.0–17.0)
Immature Granulocytes: 0 %
Lymphocytes Relative: 18 %
Lymphs Abs: 1.5 10*3/uL (ref 0.7–4.0)
MCH: 32.2 pg (ref 26.0–34.0)
MCHC: 33.3 g/dL (ref 30.0–36.0)
MCV: 96.8 fL (ref 80.0–100.0)
Monocytes Absolute: 0.7 10*3/uL (ref 0.1–1.0)
Monocytes Relative: 9 %
Neutro Abs: 5.7 10*3/uL (ref 1.7–7.7)
Neutrophils Relative %: 71 %
Platelets: 285 10*3/uL (ref 150–400)
RBC: 4.35 MIL/uL (ref 4.22–5.81)
RDW: 13.2 % (ref 11.5–15.5)
WBC: 8 10*3/uL (ref 4.0–10.5)
nRBC: 0 % (ref 0.0–0.2)

## 2020-09-05 LAB — BASIC METABOLIC PANEL
Anion gap: 9 (ref 5–15)
BUN: 6 mg/dL (ref 6–20)
CO2: 24 mmol/L (ref 22–32)
Calcium: 9.4 mg/dL (ref 8.9–10.3)
Chloride: 109 mmol/L (ref 98–111)
Creatinine, Ser: 1.24 mg/dL (ref 0.61–1.24)
GFR, Estimated: >60 mL/min (ref >60)
Glucose, Bld: 87 mg/dL (ref 70–99)
Potassium: 3.7 mmol/L (ref 3.5–5.1)
Sodium: 142 mmol/L (ref 135–145)

## 2020-09-05 MED ORDER — LIDOCAINE HCL (PF) 1 % IJ SOLN
INTRAMUSCULAR | Status: AC
  Filled 2020-09-05: qty 30

## 2020-09-05 MED ORDER — HYDROCODONE-ACETAMINOPHEN 5-325 MG PO TABS: 1.0000 | ORAL_TABLET | Freq: Four times a day (QID) | ORAL | 0 refills | Status: AC | PRN

## 2020-09-05 MED ORDER — IOHEXOL 300 MG/ML  SOLN
75.0000 mL | Freq: Once | INTRAMUSCULAR | Status: AC | PRN
  Administered 2020-09-05: 75 mL via INTRAVENOUS

## 2020-09-05 MED ORDER — FENTANYL CITRATE (PF) 100 MCG/2ML IJ SOLN
100.0000 ug | Freq: Once | INTRAMUSCULAR | Status: AC
  Administered 2020-09-05: 100 ug via INTRAVENOUS
  Filled 2020-09-05: qty 2

## 2020-09-05 MED ORDER — FENTANYL CITRATE (PF) 100 MCG/2ML IJ SOLN
INTRAMUSCULAR | Status: AC
  Administered 2020-09-05: 50 ug via INTRAVENOUS
  Filled 2020-09-05: qty 2

## 2020-09-05 NOTE — Discharge Instructions (Addendum)
 ?  ayron Geralyn Flash:  Thank you for allowing [place] to take care of you today.  We hope you begin feeling better soon.  To-Do: Please follow-up with your primary doctor or call to schedule an appointment with a new primary care doctor Take norco as needed for pain. You may take ibuprofen for break through pain as needed.  Keep wound clean and dry, cover with clean bandage. Do not submerge.  Please return to the Emergency Department or call 911 if you experience chest pain, shortness of breath, severe pain, severe fever, altered mental status, or have any reason to think that you need emergency medical care.  Thank you again.  Hope you feel better soon.
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
## 2020-10-27 ENCOUNTER — Ambulatory Visit (HOSPITAL_COMMUNITY)
Admission: EM | Admit: 2020-10-27 | Discharge: 2020-10-27 | Disposition: A | Discharge status: Home / Self Care | Payer: Self-pay | Attending: Emergency Medicine | Admitting: Emergency Medicine

## 2020-10-27 ENCOUNTER — Ambulatory Visit (HOSPITAL_COMMUNITY): Admission: EM | Admit: 2020-10-27 | Discharge: 2020-10-27 | Discharge status: Against Medical Advice | Payer: Self-pay

## 2020-10-27 ENCOUNTER — Other Ambulatory Visit: Payer: Self-pay

## 2020-10-27 ENCOUNTER — Encounter (HOSPITAL_COMMUNITY): Payer: Self-pay

## 2020-10-27 DIAGNOSIS — R369 Urethral discharge, unspecified: Secondary | ICD-10-CM | POA: Insufficient documentation

## 2020-10-27 MED ORDER — LIDOCAINE HCL (PF) 1 % IJ SOLN
INTRAMUSCULAR | Status: AC
  Filled 2020-10-27: qty 2

## 2020-10-27 MED ORDER — CEFTRIAXONE SODIUM 500 MG IJ SOLR
INTRAMUSCULAR | Status: AC
  Filled 2020-10-27: qty 500

## 2020-10-27 MED ORDER — DOXYCYCLINE HYCLATE 100 MG PO CAPS: 100.0000 mg | ORAL_CAPSULE | Freq: Two times a day (BID) | ORAL | 0 refills | Status: AC

## 2020-10-27 MED ORDER — CEFTRIAXONE SODIUM 500 MG IJ SOLR
500.0000 mg | Freq: Once | INTRAMUSCULAR | Status: AC
Start: 2020-10-27 — End: 2020-10-27
  Administered 2020-10-27: 500 mg via INTRAMUSCULAR

## 2020-10-27 NOTE — Discharge Instructions (Addendum)
Take the doxycycline twice daily for 7 days.  Take it with food.  Abstain from sexual activity until after you have completed treatment.  If your symptoms persist return for reevaluation or follow-up with the health department.
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
## 2020-10-27 NOTE — ED Triage Notes (Signed)
Pt in with c/o penile discharge and burning during urination  Denies any dysuria, hematuria, or urinary frequency

## 2020-10-28 LAB — CYTOLOGY, (ORAL, ANAL, URETHRAL) ANCILLARY ONLY
Chlamydia: NEGATIVE
Comment: NEGATIVE
Comment: NEGATIVE
Comment: NORMAL
Neisseria Gonorrhea: POSITIVE — AB
Trichomonas: NEGATIVE

## 2021-04-08 IMAGING — CT CT CHEST W/ CM
2 of 3 series · 15 of 36 positions shown, 18 images · IV contrast (omnipaque)
Comparison: None.

CLINICAL DATA: Laceration posteriorly

EXAM:
CT CHEST WITH CONTRAST
TECHNIQUE: Multidetector CT imaging of the chest was performed during
intravenous contrast administration.
CONTRAST:  75mL OMNIPAQUE IOHEXOL 300 MG/ML  SOLN

[Series 3: chest with 2mm st · axial · 0.71mm/px · z∈[+1090,+1378]mm · 12 of 170 slices shown, 15 images]
[im 13/170  mediastinal]
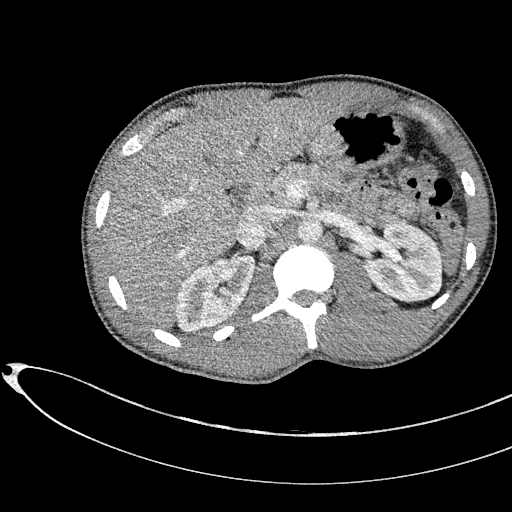
[im 13/170  lung]
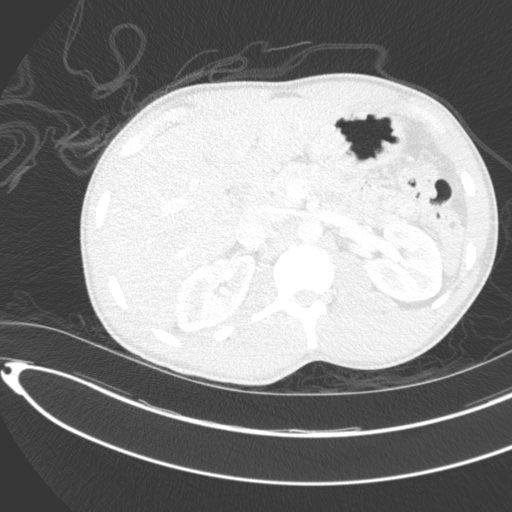
[im 26/170  lung]
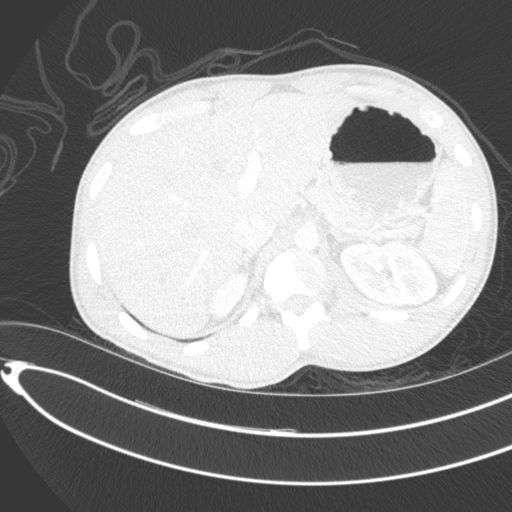
[im 38/170  lung]
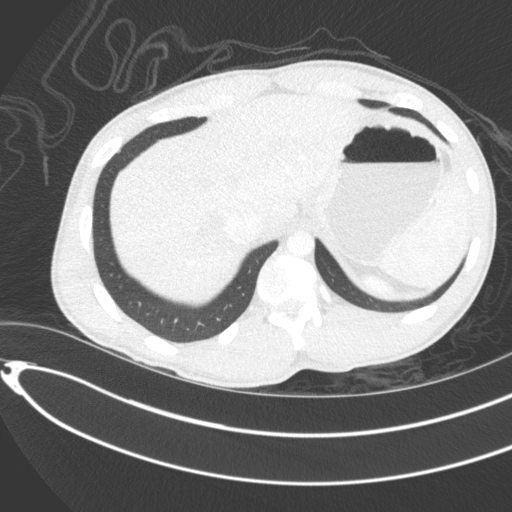
[im 51/170  lung]
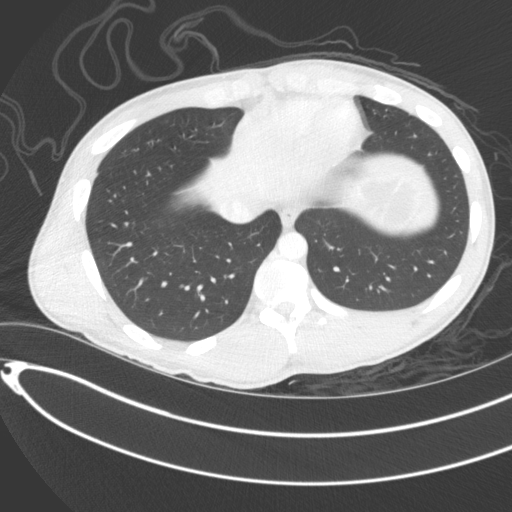
[im 63/170  mediastinal]
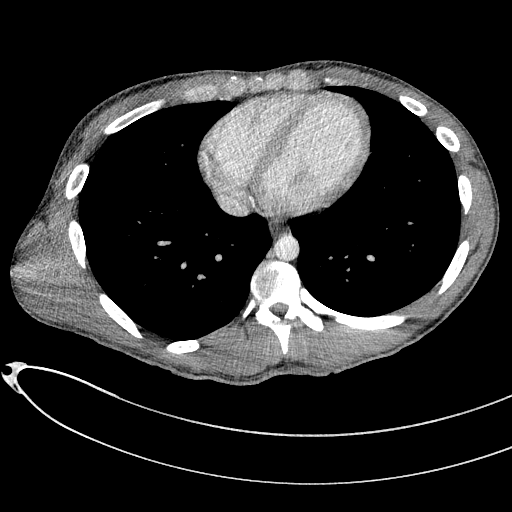
[im 63/170  lung]
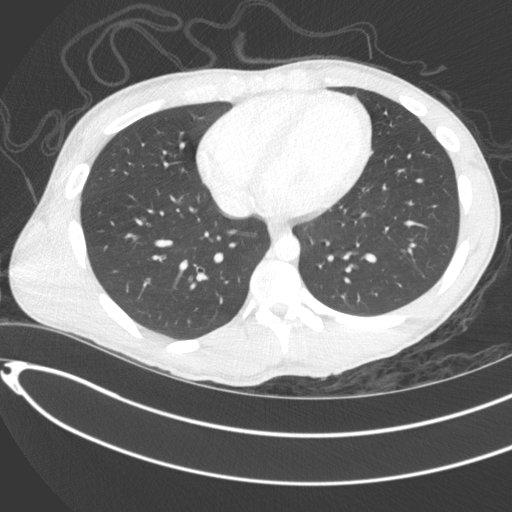
[im 76/170  lung]
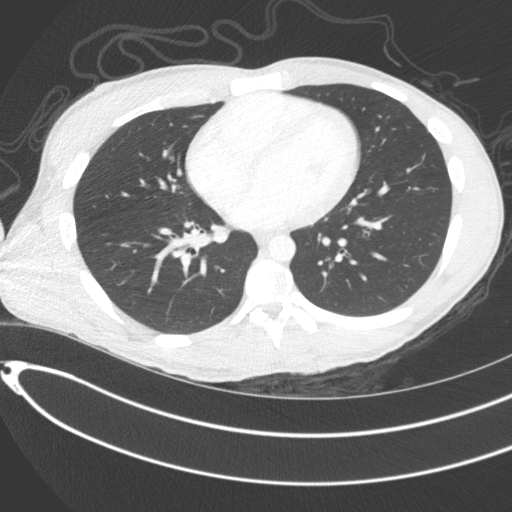
[im 94/170  lung]
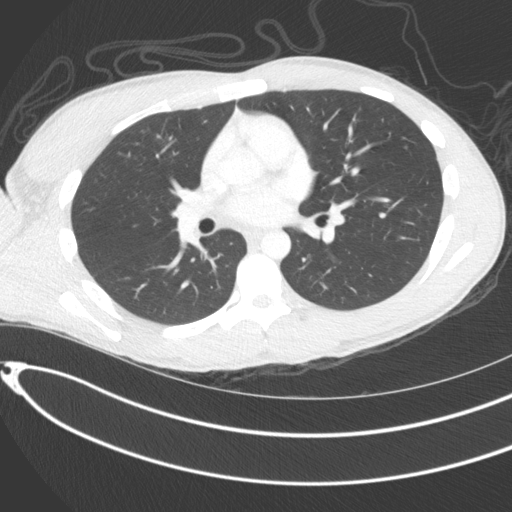
[im 107/170  lung]
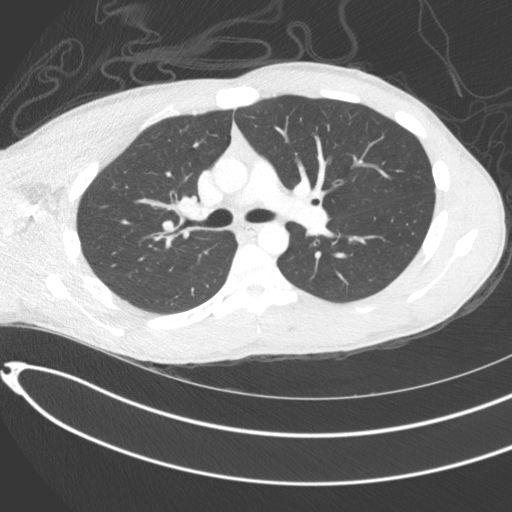
[im 119/170  mediastinal]
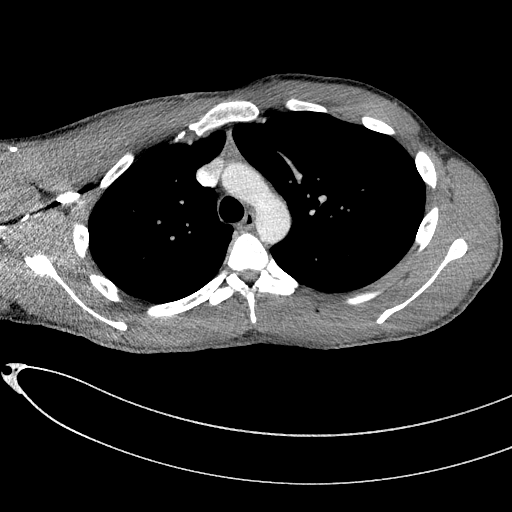
[im 119/170  lung]
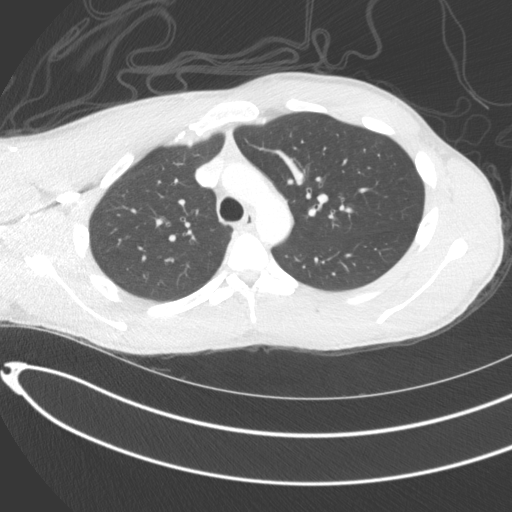
[im 132/170  lung]
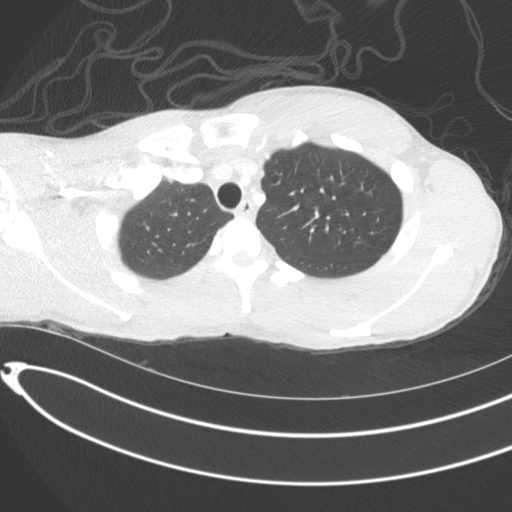
[im 144/170  lung]
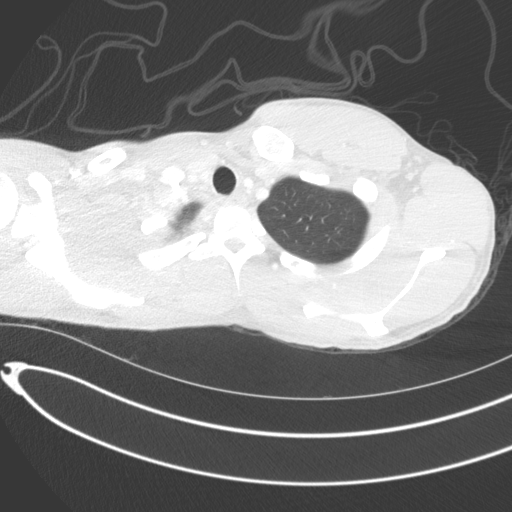
[im 157/170  lung]
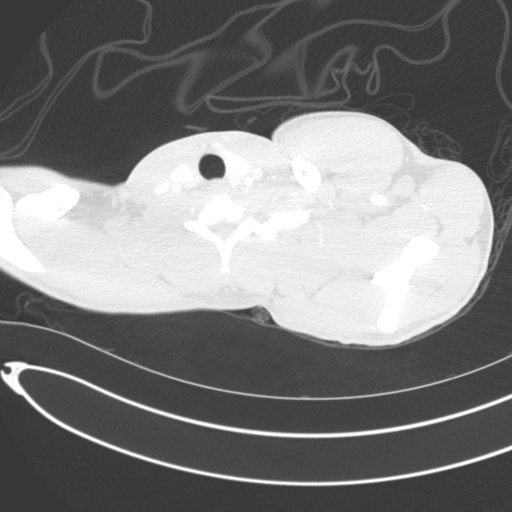

[Series 6: chest with 2mm st cor · coronal · 0.73mm/px · 3 of 127 slices shown]
[im 26/127  lung]
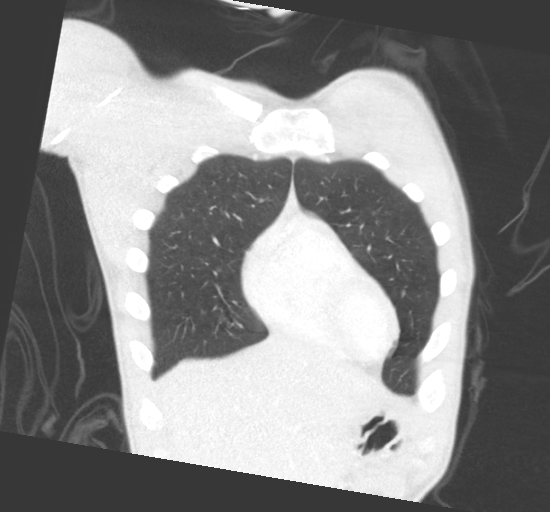
[im 51/127  lung]
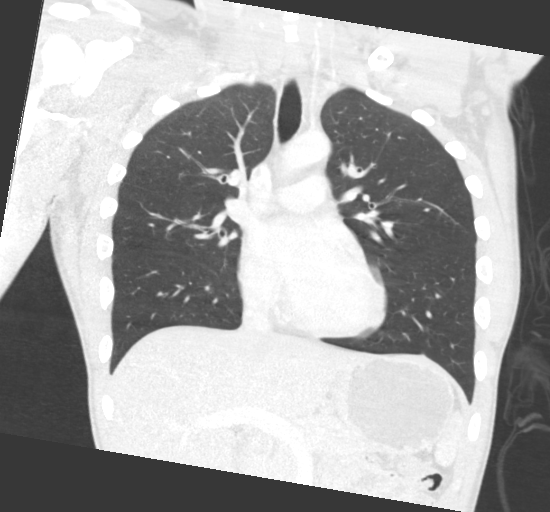
[im 76/127  lung]
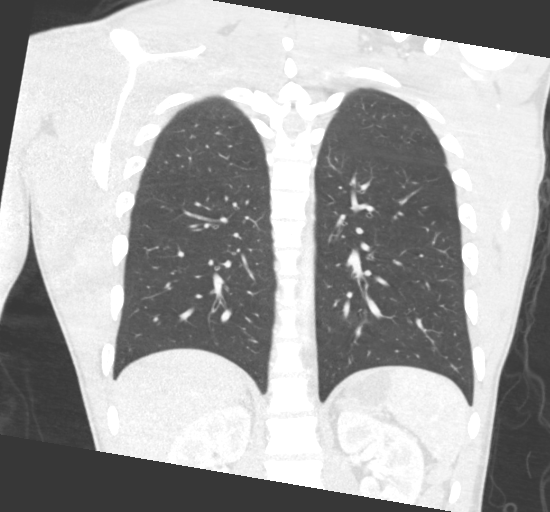

[15 of 36 positions shown; findings below may reference images not displayed]

FINDINGS: Cardiovascular: No evident mediastinal hematoma. No lesion involving
the thoracic aorta evident. Visualized great vessels appear normal.
No pericardial effusion or pericardial thickening evident.

Mediastinum/Nodes: Thyroid appears normal. No evident thoracic
adenopathy. Thymic tissue is normal for age. No esophageal lesions
evident. No evident pneumomediastinum.

Lungs/Pleura: No evident pneumothorax. Lungs are clear. No pleural
effusions are evident.

Upper Abdomen: Visualized upper abdominal structures appear normal.

Musculoskeletal: There are foci of air in the deep muscles posterior
to the upper left ribs without associated radiopaque foreign body.
No evident hematoma within these muscles. There is no demonstrable
fracture or dislocation. No blastic or lytic bone lesions. No other
chest wall lesions.
IMPRESSION: 1. Scattered foci of air in the deep muscles posterior to the ribs
in the left posterior hemithorax without associated edema or
radiopaque foreign body.

2.  No bony abnormality.

3.  No pneumothorax or pneumomediastinum.

4.  Lungs clear.

5.  No evident adenopathy.

## 2021-05-07 ENCOUNTER — Ambulatory Visit (HOSPITAL_COMMUNITY): Payer: Self-pay | Admitting: Licensed Clinical Social Worker

## 2021-05-14 ENCOUNTER — Ambulatory Visit (HOSPITAL_COMMUNITY): Payer: Self-pay | Admitting: Psychiatry

## 2022-04-06 ENCOUNTER — Other Ambulatory Visit: Payer: Self-pay | Admitting: Cardiothoracic Surgery

## 2022-04-06 ENCOUNTER — Emergency Department (HOSPITAL_COMMUNITY)
Admission: EM | Admit: 2022-04-06 | Discharge: 2022-04-06 | Disposition: A | Discharge status: Home / Self Care | Payer: Self-pay | Attending: Emergency Medicine | Admitting: Emergency Medicine

## 2022-04-06 ENCOUNTER — Other Ambulatory Visit: Payer: Self-pay

## 2022-04-06 ENCOUNTER — Emergency Department (HOSPITAL_COMMUNITY): Payer: Self-pay

## 2022-04-06 ENCOUNTER — Encounter (HOSPITAL_COMMUNITY): Payer: Self-pay

## 2022-04-06 DIAGNOSIS — J982 Interstitial emphysema: Secondary | ICD-10-CM

## 2022-04-06 DIAGNOSIS — J45901 Unspecified asthma with (acute) exacerbation: Secondary | ICD-10-CM

## 2022-04-06 DIAGNOSIS — J4541 Moderate persistent asthma with (acute) exacerbation: Secondary | ICD-10-CM | POA: Insufficient documentation

## 2022-04-06 LAB — COMPREHENSIVE METABOLIC PANEL
ALT: 23 U/L (ref 0–44)
AST: 24 U/L (ref 15–41)
Albumin: 4.1 g/dL (ref 3.5–5.0)
Alkaline Phosphatase: 68 U/L (ref 38–126)
Anion gap: 13 (ref 5–15)
BUN: 7 mg/dL (ref 6–20)
CO2: 18 mmol/L — ABNORMAL LOW (ref 22–32)
Calcium: 9.7 mg/dL (ref 8.9–10.3)
Chloride: 106 mmol/L (ref 98–111)
Creatinine, Ser: 0.97 mg/dL (ref 0.61–1.24)
GFR, Estimated: >60 mL/min (ref >60)
Glucose, Bld: 119 mg/dL — ABNORMAL HIGH (ref 70–99)
Potassium: 3 mmol/L — ABNORMAL LOW (ref 3.5–5.1)
Sodium: 137 mmol/L (ref 135–145)
Total Bilirubin: 0.7 mg/dL (ref 0.3–1.2)
Total Protein: 7.8 g/dL (ref 6.5–8.1)

## 2022-04-06 LAB — CBC WITH DIFFERENTIAL/PLATELET
Abs Immature Granulocytes: 0.02 10*3/uL (ref 0.00–0.07)
Basophils Absolute: 0.1 10*3/uL (ref 0.0–0.1)
Basophils Relative: 1 %
Eosinophils Absolute: 0.2 10*3/uL (ref 0.0–0.5)
Eosinophils Relative: 1 %
HCT: 41.9 % (ref 39.0–52.0)
Hemoglobin: 15.1 g/dL (ref 13.0–17.0)
Immature Granulocytes: 0 %
Lymphocytes Relative: 15 %
Lymphs Abs: 1.5 10*3/uL (ref 0.7–4.0)
MCH: 33.6 pg (ref 26.0–34.0)
MCHC: 36 g/dL (ref 30.0–36.0)
MCV: 93.3 fL (ref 80.0–100.0)
Monocytes Absolute: 0.9 10*3/uL (ref 0.1–1.0)
Monocytes Relative: 9 %
Neutro Abs: 7.9 10*3/uL — ABNORMAL HIGH (ref 1.7–7.7)
Neutrophils Relative %: 74 %
Platelets: 254 10*3/uL (ref 150–400)
RBC: 4.49 MIL/uL (ref 4.22–5.81)
RDW: 12 % (ref 11.5–15.5)
WBC: 10.6 10*3/uL — ABNORMAL HIGH (ref 4.0–10.5)
nRBC: 0 % (ref 0.0–0.2)

## 2022-04-06 LAB — TROPONIN I (HIGH SENSITIVITY): Troponin I (High Sensitivity): 3 ng/L (ref <18)

## 2022-04-06 MED ORDER — METHYLPREDNISOLONE SODIUM SUCC 125 MG IJ SOLR
125.0000 mg | Freq: Once | INTRAMUSCULAR | Status: AC
  Administered 2022-04-06: 125 mg via INTRAVENOUS
  Filled 2022-04-06: qty 2

## 2022-04-06 MED ORDER — IPRATROPIUM BROMIDE 0.02 % IN SOLN
0.5000 mg | Freq: Once | RESPIRATORY_TRACT | Status: AC
  Administered 2022-04-06: 0.5 mg via RESPIRATORY_TRACT
  Filled 2022-04-06: qty 2.5

## 2022-04-06 MED ORDER — MAGNESIUM SULFATE 2 GM/50ML IV SOLN
2.0000 g | Freq: Once | INTRAVENOUS | Status: AC
  Administered 2022-04-06: 2 g via INTRAVENOUS
  Filled 2022-04-06: qty 50

## 2022-04-06 MED ORDER — POTASSIUM CHLORIDE 20 MEQ PO PACK
40.0000 meq | PACK | Freq: Once | ORAL | Status: AC
  Administered 2022-04-06: 40 meq via ORAL
  Filled 2022-04-06: qty 2

## 2022-04-06 MED ORDER — IPRATROPIUM-ALBUTEROL 0.5-2.5 (3) MG/3ML IN SOLN
3.0000 mL | Freq: Once | RESPIRATORY_TRACT | Status: AC
Start: 2022-04-06 — End: 2022-04-06
  Administered 2022-04-06: 3 mL via RESPIRATORY_TRACT
  Filled 2022-04-06: qty 3

## 2022-04-06 MED ORDER — PREDNISONE 10 MG PO TABS: 40.0000 mg | ORAL_TABLET | Freq: Every day | ORAL | 0 refills | Status: DC

## 2022-04-06 MED ORDER — TRAMADOL HCL 50 MG PO TABS: 50.0000 mg | ORAL_TABLET | Freq: Four times a day (QID) | ORAL | 0 refills | Status: DC | PRN

## 2022-04-06 MED ORDER — ALBUTEROL SULFATE HFA 108 (90 BASE) MCG/ACT IN AERS
2.0000 | INHALATION_SPRAY | Freq: Once | RESPIRATORY_TRACT | Status: AC
  Administered 2022-04-06: 2 via RESPIRATORY_TRACT
  Filled 2022-04-06: qty 6.7

## 2022-04-06 MED ORDER — ALBUTEROL SULFATE HFA 108 (90 BASE) MCG/ACT IN AERS: 2.0000 | INHALATION_SPRAY | RESPIRATORY_TRACT | 1 refills | Status: AC | PRN

## 2022-04-06 MED ORDER — ALBUTEROL SULFATE (2.5 MG/3ML) 0.083% IN NEBU: 10.0000 mg/h | INHALATION_SOLUTION | RESPIRATORY_TRACT | Status: DC

## 2022-04-06 MED ORDER — ALBUTEROL SULFATE (2.5 MG/3ML) 0.083% IN NEBU
10.0000 mg/h | INHALATION_SOLUTION | RESPIRATORY_TRACT | Status: DC
  Administered 2022-04-06: 10 mg/h via RESPIRATORY_TRACT
  Filled 2022-04-06: qty 12

## 2022-04-06 NOTE — Discharge Instructions (Addendum)
You have been seen and discharged from the emergency department.  Use inhaler as directed.  Take steroids as prescribed.  Take stronger pain medicine as needed.  Do not mix this medication with alcohol or other sedating medications. Do not drive or do heavy physical activity until you know how this medication affects you.  It may cause drowsiness.  You were seen by cardiothoracic surgeon, Dr. Prescott Gum.  His office will contact you and arrange outpatient follow-up in the office for repeat chest x-ray.  If you have any worsening symptoms, severe chest pain, difficulty breathing or further concerns for your health please return to an emergency department for further evaluation.

## 2022-04-06 NOTE — Consult Note (Signed)
[address]            [place] [id number]          [phone]       Elsie RaDayron L [facility] Medical Record #[id number] Date of Birth: 1991-07-02  No ref. provider found ED physician Patient, No Pcp Per (Inactive)  Chief Complaint:    Chief Complaint  Patient presents with   Shortness of Breath  Patient examined, images of chest x-ray and CT scan of chest personally reviewed and counseled patient.  History of Present Illness:     31 year old male smoker evaluated in the ED for shortness of breath and chest discomfort associated with mild pneumomediastinum without pneumothorax on chest x-ray and CT scan.  Patient has history of asthma.  He states he had 1 previous episode of pneumomediastinum with similar symptoms approximately 3 years ago.  He improved after 2 to 3 days.  He denies any forceful emesis.  He denies any difficulty swallowing.  His electrolytes are within normal limits.  He has been treated with inhaler in the ED with improvement in symptoms.  Is able to get up out of bed and walk to the bathroom and back.  He is on room air with adequate saturation.  No previous history of thoracic trauma or pneumothorax.   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, REMOVAL OF BULLET;  Surgeon: Coletta Memosabbell, Kyle, MD;  Location: [facility];  Service: Neurosurgery;  Laterality: N/A;   INCISION AND DRAINAGE ABSCESS Bilateral 10/24/2015   Procedure: INCISION AND DRAINAGE ABSCESS;  Surgeon: Jimmye NormanJames Wyatt, MD;  Location: [facility];  Service: General;  Laterality: Bilateral;  Prone position    Social History   Tobacco Use  Smoking Status Every Day   Packs/day: 0.50   Types: Cigarettes   Smokeless Tobacco Never    Social History   Substance and Sexual Activity  Alcohol Use Yes    Social History   Socioeconomic History   Marital status: Single    Spouse name: Not on file   Number of children: Not on file   Years of education: Not on file   Highest education level: Not on file  Occupational History   Not on file  Tobacco Use   Smoking status: Every Day    Packs/day: 0.50    Types: Cigarettes   Smokeless tobacco: Never  Vaping Use   Vaping Use: Former  Substance and Sexual Activity   Alcohol use: Yes   Drug use: Yes    Types: Marijuana   Sexual activity: Not on file  Other Topics Concern   Not on file  Social History Narrative   ** Merged History Encounter **       Social Determinants of Health   Financial Resource Strain: Not on file  Food Insecurity: Not on file  Transportation Needs: Not on file  Physical Activity: Not on file  Stress: Not on file  Social Connections: Not on file  Intimate Partner Violence: Not on file    Allergies  Allergen Reactions   Shrimp [Shellfish Allergy] Swelling   Fish Allergy     Current Facility-Administered Medications  Medication Dose Route Frequency Provider Last Rate Last Admin   albuterol (PROVENTIL) (2.5 MG/3ML) 0.083% nebulizer solution  10 mg/hr Nebulization Continuous Tilden Fossa, MD 12 mL/hr at 04/06/22 0400 10 mg/hr at 04/06/22 0400   Current Outpatient Medications  Medication Sig Dispense Refill   albuterol (VENTOLIN HFA) 108 (90 Base) MCG/ACT inhaler Inhale 2 puffs into the lungs every 4 (four) hours as needed for wheezing or shortness of breath. 18 g 1   predniSONE (DELTASONE) 10 MG tablet Take 4 tablets (40 mg total) by mouth daily. 20 tablet 0   acetaminophen (TYLENOL) 500 MG tablet Take 2 tablets (1,000 mg total) by mouth every 8 (eight) hours as needed for mild pain. (Patient not taking: Reported on 06/06/2020)     levETIRAcetam (KEPPRA) 500 MG tablet Take 1 tablet (500 mg total) by  mouth 2 (two) times daily. (Patient not taking: Reported on 06/06/2020) 14 tablet 0   oxyCODONE (OXY IR/ROXICODONE) 5 MG immediate release tablet Take 1 tablet (5 mg total) by mouth every 6 (six) hours as needed for breakthrough pain. (Patient not taking: Reported on 06/06/2020) 15 tablet 0     Family History  Family history unknown: Yes     Review of Systems:     Cardiac Review of Systems: Y or N  Chest Pain [  x  ]  Resting SOB [   ] Exertional SOB  [ x ]  Orthopnea [  ]   Pedal Edema [   ]    Palpitations [  ] Syncope  [  ]   Presyncope [   ]  General Review of Systems: [Y] = yes [  ]=no Constitional: recent weight change [  ]; anorexia [  ]; fatigue [  ]; nausea [  ]; night sweats [  ]; fever [  ]; or chills [  ];                                                                                                                                          Dental: poor dentition[  ]; Last Dentist visit: >1 yr  Eye : blurred vision [  ]; diplopia [   ]; vision changes [  ];  Amaurosis fugax[  ]; Resp: cough [  x];  wheezing[x  ];  hemoptysis[  ]; shortness of breath[ x ]; paroxysmal nocturnal dyspnea[  ]; dyspnea on exertion[  ]; or orthopnea[  ];  GI:  gallstones[  ], vomiting[  ];  dysphagia[  ]; melena[  ];  hematochezia [  ]; heartburn[  ];   Hx of  Colonoscopy[  ]; GU: kidney stones [  ]; hematuria[  ];   dysuria [  ];  nocturia[  ];  history of     obstruction [  ];                 Skin:  rash, swelling[  ];, hair loss[  ];  peripheral edema[  ];  or itching[  ]; Musculosketetal: myalgias[  ];  joint swelling[  ];  joint erythema[  ];  joint pain[  ];  back pain[  ];  Heme/Lymph: bruising[  ];  bleeding[  ];  anemia[  ];  Neuro: TIA[  ];  headaches[  ];  stroke[  ];  vertigo[  ];  seizures[  ];   paresthesias[  ];  difficulty walking[  ];  Psych:depression[  ]; anxiety[  ];  Endocrine: diabetes[  ];  thyroid dysfunction[  ];  Immunizations: Flu [  ]; Pneumococcal[  ];  Other:  Physical  Exam: BP (!) 145/76   Pulse 93   Temp 98.1 F (36.7 C) (Oral)   Resp (!) 22   Ht  (1.727 m)   Wt 72.6 kg   SpO2 97%   BMI 24.33 kg/m       Physical Exam  General: Well-nourished 31 year old BM no distress breathing comfortably NAD. HEENT: No crepitus or subcutaneous emphysema in the neck or face.  Normocephalic pupils equal , dentition adequate Neck: Supple without JVD, adenopathy, or bruit Chest: No subcutaneous emphysema or crepitus in the chest.  Mild diffuse wheezing to auscultation, symmetrical breath sounds, no rhonchi, no tenderness             or deformity Cardiovascular: Regular rate and rhythm, no murmur, no gallop, peripheral pulses             palpable in all extremities Abdomen:  Soft, nontender, no palpable mass or organomegaly Extremities: Warm, well-perfused, no clubbing cyanosis edema or tenderness,              no venous stasis changes of the legs Rectal/GU: Deferred Neuro: Grossly non--focal and symmetrical throughout Skin: Clean and dry without rash or ulceration    Diagnostic Studies & Laboratory data:     Recent Radiology Findings:   CT Chest Wo Contrast  Result Date: 04/06/2022 CLINICAL DATA:  31 year old male with history of diaphoresis and difficulty breathing for the last 24 hours. History of asthma. EXAM: CT CHEST WITHOUT CONTRAST TECHNIQUE: Multidetector CT imaging of the chest was performed following the standard protocol without IV contrast. RADIATION DOSE REDUCTION: This exam was performed according to the departmental dose-optimization program which includes automated exposure control, adjustment of the mA and/or kV according to patient size and/or use of iterative reconstruction technique. COMPARISON:  Chest CT 09/05/2020. FINDINGS: Cardiovascular: Heart size is normal. There is no significant pericardial fluid, thickening or pericardial calcification. No atherosclerotic calcifications are noted in the thoracic aorta or the coronary arteries.  Mediastinum/Nodes: Large amount of pneumomediastinum. No pathologically enlarged mediastinal or hilar lymph nodes. Esophagus is unremarkable in appearance. No axillary lymphadenopathy. Lungs/Pleura: No pneumothorax. No acute consolidative airspace disease. No pleural effusions. No suspicious appearing pulmonary nodules or masses are noted. Upper Abdomen: Unremarkable. Musculoskeletal: There are no aggressive appearing lytic or blastic lesions noted in the visualized portions of the skeleton. IMPRESSION: 1. Study is positive for pneumomediastinum. This may be spontaneous in the setting of asthma. No definite cause of pneumomediastinum is confidently identified on today's examination. 2. No associated pneumothorax. Electronically Signed   By: Trudie Reed M.D.   On: 04/06/2022 06:16   DG Chest Port 1 View  Result Date: 04/06/2022 CLINICAL DATA:  Shortness of breath.  Asthma. EXAM: PORTABLE CHEST 1 VIEW COMPARISON:  09/05/2020 FINDINGS: Artifact from EKG leads. Prominent outlining of left hilum and carina  structures. Normal heart size and mediastinal contours. There is no edema, consolidation, effusion, or pneumothorax. IMPRESSION: Possible mild pneumomediastinum.  No pneumothorax or infiltrate. Electronically Signed   By: Tiburcio Pea M.D.   On: 04/06/2022 04:13      Recent Lab Findings: Lab Results  Component Value Date   WBC 10.6 (H) 04/06/2022   HGB 15.1 04/06/2022   HCT 41.9 04/06/2022   PLT 254 04/06/2022   GLUCOSE 119 (H) 04/06/2022   CHOL 219 (H) 07/03/2010   TRIG 130 07/03/2010   HDL 41 07/03/2010   LDLCALC 152 (H) 07/03/2010   ALT 23 04/06/2022   AST 24 04/06/2022   NA 137 04/06/2022   K 3.0 (L) 04/06/2022   CL 106 04/06/2022   CREATININE 0.97 04/06/2022   BUN 7 04/06/2022   CO2 18 (L) 04/06/2022   INR 1.07 10/18/2015      Assessment / Plan:   Pneumomediastinum related to asthmatic flareup.\No evidence of hemodynamic compromise. No evidence of hemothorax.  Patient  needs treatment of his asthma, pain control and no strenuous activity for 48 hours.  He is also recommended to stop smoking and vaping.  He will need a follow-up chest x-ray which will be arranged through our office later this week.  He will call our office if he has further worsening of symptoms for possible need of admission.  The ED team will provide him prescriptions for bronchodilator therapy antitussive therapy and tramadol for pain control.   ED physician

## 2022-04-06 NOTE — ED Provider Notes (Signed)
[facility] EMERGENCY DEPARTMENT Provider Note   CSN: [id number] Arrival date & time: 04/06/22  5681     History  Chief Complaint  Patient presents with   Shortness of Breath    Christian Salazar is a 31 y.o. male.  The history is provided by the patient and medical records.  Shortness of Breath Christian Salazar is a 31 y.o. male who presents to the Emergency Department complaining of sob.  He presents to the emergency department for evaluation of shortness of breath.  Symptoms started yesterday with difficulty breathing and chest tightness.  When he woke at 2 AM his symptoms had severely worsened and he went to [facility] and attempted to get an inhaler.  He did take the inhaler with no significant improvement in his symptoms.  He has associated chest tightness.  No fevers.  No cough.  No abdominal pain, nausea, vomiting, leg swelling or pain.     Home Medications Prior to Admission medications   Medication Sig Start Date End Date Taking? Authorizing Provider  albuterol (VENTOLIN HFA) 108 (90 Base) MCG/ACT inhaler Inhale 2 puffs into the lungs every 4 (four) hours as needed for wheezing or shortness of breath. 04/06/22  Yes Tilden Fossa, MD  predniSONE (DELTASONE) 10 MG tablet Take 4 tablets (40 mg total) by mouth daily. 04/06/22  Yes Tilden Fossa, MD  acetaminophen (TYLENOL) 500 MG tablet Take 2 tablets (1,000 mg total) by mouth every 8 (eight) hours as needed for mild pain. Patient not taking: Reported on 06/06/2020 05/13/20   Jacinto Halim, PA-C  levETIRAcetam (KEPPRA) 500 MG tablet Take 1 tablet (500 mg total) by mouth 2 (two) times daily. Patient not taking: Reported on 06/06/2020 05/13/20   Maczis, Elmer Sow, PA-C  oxyCODONE (OXY IR/ROXICODONE) 5 MG immediate release tablet Take 1 tablet (5 mg total) by mouth every 6 (six) hours as needed for breakthrough pain. Patient not taking: Reported on 06/06/2020 05/13/20   Jacinto Halim, PA-C      Allergies     Shrimp [shellfish allergy] and Fish allergy    Review of Systems   Review of Systems  Respiratory:  Positive for shortness of breath.   All other systems reviewed and are negative.   Physical Exam Updated Vital Signs BP 138/70   Pulse 87   Temp 98.1 F (36.7 C) (Oral)   Resp (!) 29   Ht 5\' 8"  (1.727 m)   Wt 72.6 kg   SpO2 90%   BMI 24.33 kg/m  Physical Exam Vitals and nursing note reviewed.  Constitutional:      Appearance: He is well-developed.  HENT:     Head: Normocephalic and atraumatic.  Cardiovascular:     Rate and Rhythm: Normal rate and regular rhythm.     Heart sounds: No murmur heard. Pulmonary:     Effort: Respiratory distress present.     Breath sounds: Wheezing present.     Comments: Tachypnea.  Speaks in short phrases.  Accessory muscle use.  Wheezes in all lung fields Abdominal:     Palpations: Abdomen is soft.     Tenderness: There is no abdominal tenderness. There is no guarding or rebound.  Musculoskeletal:        General: No swelling or tenderness.  Skin:    General: Skin is warm and dry.  Neurological:     Mental Status: He is alert and oriented to person, place, and time.  Psychiatric:        Behavior:  Behavior normal.     ED Results / Procedures / Treatments   Labs (all labs ordered are listed, but only abnormal results are displayed) Labs Reviewed  COMPREHENSIVE METABOLIC PANEL - Abnormal; Notable for the following components:      Result Value   Potassium 3.0 (*)    CO2 18 (*)    Glucose, Bld 119 (*)    All other components within normal limits  CBC WITH DIFFERENTIAL/PLATELET - Abnormal; Notable for the following components:   WBC 10.6 (*)    Neutro Abs 7.9 (*)    All other components within normal limits  TROPONIN I (HIGH SENSITIVITY)    EKG EKG Interpretation  Date/Time:  Monday April 06 2022 03:52:53 EDT Ventricular Rate:  80 PR Interval:  169 QRS Duration: 86 QT Interval:  365 QTC Calculation: 421 R Axis:   76 Text  Interpretation: Sinus rhythm Confirmed by Tilden Fossaees, Finnigan Warriner [phone]([id number]) on 04/06/2022 4:37:26 AM  Radiology CT Chest Wo Contrast  Result Date: 04/06/2022 CLINICAL DATA:  31 year old male with history of diaphoresis and difficulty breathing for the last 24 hours. History of asthma. EXAM: CT CHEST WITHOUT CONTRAST TECHNIQUE: Multidetector CT imaging of the chest was performed following the standard protocol without IV contrast. RADIATION DOSE REDUCTION: This exam was performed according to the departmental dose-optimization program which includes automated exposure control, adjustment of the mA and/or kV according to patient size and/or use of iterative reconstruction technique. COMPARISON:  Chest CT 09/05/2020. FINDINGS: Cardiovascular: Heart size is normal. There is no significant pericardial fluid, thickening or pericardial calcification. No atherosclerotic calcifications are noted in the thoracic aorta or the coronary arteries. Mediastinum/Nodes: Large amount of pneumomediastinum. No pathologically enlarged mediastinal or hilar lymph nodes. Esophagus is unremarkable in appearance. No axillary lymphadenopathy. Lungs/Pleura: No pneumothorax. No acute consolidative airspace disease. No pleural effusions. No suspicious appearing pulmonary nodules or masses are noted. Upper Abdomen: Unremarkable. Musculoskeletal: There are no aggressive appearing lytic or blastic lesions noted in the visualized portions of the skeleton. IMPRESSION: 1. Study is positive for pneumomediastinum. This may be spontaneous in the setting of asthma. No definite cause of pneumomediastinum is confidently identified on today's examination. 2. No associated pneumothorax. Electronically Signed   By: Trudie Reedaniel  Entrikin M.D.   On: 04/06/2022 06:16   DG Chest Port 1 View  Result Date: 04/06/2022 CLINICAL DATA:  Shortness of breath.  Asthma. EXAM: PORTABLE CHEST 1 VIEW COMPARISON:  09/05/2020 FINDINGS: Artifact from EKG leads. Prominent outlining of  left hilum and carina structures. Normal heart size and mediastinal contours. There is no edema, consolidation, effusion, or pneumothorax. IMPRESSION: Possible mild pneumomediastinum.  No pneumothorax or infiltrate. Electronically Signed   By: Tiburcio PeaJonathan  Watts M.D.   On: 04/06/2022 04:13    Procedures Procedures    Medications Ordered in ED Medications  albuterol (PROVENTIL) (2.5 MG/3ML) 0.083% nebulizer solution (10 mg/hr Nebulization New Bag/Given 04/06/22 0400)  albuterol (VENTOLIN HFA) 108 (90 Base) MCG/ACT inhaler 2 puff (has no administration in time range)  potassium chloride (KLOR-CON) packet 40 mEq (has no administration in time range)  methylPREDNISolone sodium succinate (SOLU-MEDROL) 125 mg/2 mL injection 125 mg (125 mg Intravenous Given 04/06/22 0405)  ipratropium (ATROVENT) nebulizer solution 0.5 mg (0.5 mg Nebulization Given 04/06/22 0401)  ipratropium-albuterol (DUONEB) 0.5-2.5 (3) MG/3ML nebulizer solution 3 mL (3 mLs Nebulization Given 04/06/22 0504)  magnesium sulfate IVPB 2 g 50 mL (0 g Intravenous Stopped 04/06/22 0601)    ED Course/ Medical Decision Making/ A&P  Medical Decision Making Amount and/or Complexity of Data Reviewed Labs: ordered. Radiology: ordered.  Risk Prescription drug management.   Patient with history of asthma here for evaluation of increased shortness of breath over the last 24 hours, significantly worsened over the last few hours.  Patient with tachypnea, wheezing and respiratory distress on ED arrival.  He was treated with albuterol, steroids with partial improvement in his symptoms.  He was treated with a second nebulizer with significant improvement.  Repeat lung exam with occasional wheezes, no respiratory distress.  He is able to ambulate the department without hypoxia or recurrent respiratory distress.  Chest x-ray with possible pneumomediastinum, images personally reviewed and interpreted.  Confirmatory CT scan obtained,  which demonstrates pneumomediastinum. D/w Dr. Donata Clay with CT surgery - will see the patient in consult.  Pt care transferred pending CT surgery eval.         Final Clinical Impression(s) / ED Diagnoses Final diagnoses:  Moderate asthma with exacerbation, unspecified whether persistent  Pneumomediastinum (HCC)    Rx / DC Orders ED Discharge Orders          Ordered    predniSONE (DELTASONE) 10 MG tablet  Daily        04/06/22 0650    albuterol (VENTOLIN HFA) 108 (90 Base) MCG/ACT inhaler  Every 4 hours PRN        04/06/22 0650              Tilden Fossa, MD 04/06/22 [phone]

## 2022-04-06 NOTE — ED Notes (Signed)
Pt verbalizes understanding of discharge instructions. Opportunity for questions and answers were provided. Pt discharged from the ED.   ?

## 2022-04-06 NOTE — ED Triage Notes (Signed)
Reports hx of asthma attempted an inhaler with no improvement.   Has been diaphoretic and having difficulty breathing for ~ 24 hours.

## 2022-04-06 NOTE — ED Notes (Signed)
Patient taken to CT at this time.

## 2022-04-06 NOTE — ED Provider Notes (Signed)
Patient signed out to me by previous provider. Please refer to their note for full HPI.  Briefly this is a 31 year old male who presented the emergency department shortness of breath.  Initially was tachypneic, given continuous breathing treatments.  Has had significant improvement, continues with intermittent tachypnea.  Chest x-ray/CT imaging is significant for mild pneumomediastinum. CT surg contacted and pending evaluation. Physical Exam  BP 133/72   Pulse 83   Temp 98.1 F (36.7 C) (Oral)   Resp 16   Ht 5\' 8"  (1.727 m)   Wt 72.6 kg   SpO2 98%   BMI 24.33 kg/m   Physical Exam Vitals and nursing note reviewed.  Constitutional:      General: He is not in acute distress.    Appearance: Normal appearance. He is not diaphoretic.  HENT:     Head: Normocephalic.     Mouth/Throat:     Mouth: Mucous membranes are moist.  Cardiovascular:     Rate and Rhythm: Normal rate.  Pulmonary:     Effort: Pulmonary effort is normal. Tachypnea present. No respiratory distress.  Skin:    General: Skin is warm.  Neurological:     Mental Status: He is alert and oriented to person, place, and time. Mental status is at baseline.     Procedures  Procedures  ED Course / MDM    Medical Decision Making Amount and/or Complexity of Data Reviewed Labs: ordered. Radiology: ordered.  Risk Prescription drug management.   Dr. Prescott Gum evaluated the patient. Agrees with inhaler, steroids and recommends pain control. They will arrange outpatient follow up in the office for repeat CXR. Patient understands, normal vitals at time of DC.        Lorelle Gibbs, DO 04/06/22 [phone]

## 2022-04-09 ENCOUNTER — Ambulatory Visit: Payer: Self-pay

## 2022-04-12 ENCOUNTER — Ambulatory Visit (HOSPITAL_COMMUNITY)
Admission: EM | Admit: 2022-04-12 | Discharge: 2022-04-12 | Disposition: A | Discharge status: Home / Self Care | Payer: Self-pay | Attending: Emergency Medicine | Admitting: Emergency Medicine

## 2022-04-12 ENCOUNTER — Encounter (HOSPITAL_COMMUNITY): Payer: Self-pay

## 2022-04-12 DIAGNOSIS — M79651 Pain in right thigh: Secondary | ICD-10-CM

## 2022-04-12 MED ORDER — IBUPROFEN 800 MG PO TABS
ORAL_TABLET | ORAL | Status: AC
Start: 2022-04-12 — End: ?
  Filled 2022-04-12: qty 1

## 2022-04-12 MED ORDER — IBUPROFEN 800 MG PO TABS
800.0000 mg | ORAL_TABLET | Freq: Once | ORAL | Status: AC
  Administered 2022-04-12: 800 mg via ORAL

## 2022-04-12 NOTE — Discharge Instructions (Signed)
Recommend ibuprofen every 6 hours.  You can apply hot pad to the area to loosen up the muscles.  The combination of these two things should help a lot with your muscle pain.   If you feel your symptoms are worsening please go to the emergency department.
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
## 2022-04-12 NOTE — ED Triage Notes (Signed)
Pt reports falling today and states he feels he pulled a muscle and c/o he is barely able to walk.

## 2022-11-07 IMAGING — CT CT CHEST W/O CM
2 of 4 series · 15 of 36 positions shown, 18 images · non-contrast
Comparison: Chest CT 09/05/2020.

CLINICAL DATA: 30-year-old male with history of diaphoresis and
difficulty breathing for the last 24 hours. History of asthma.



[Series 3: chest wo · axial · 0.66mm/px · z∈[+954,+1236]mm · 12 of 165 slices shown, 15 images]
[im 12/165  mediastinal]
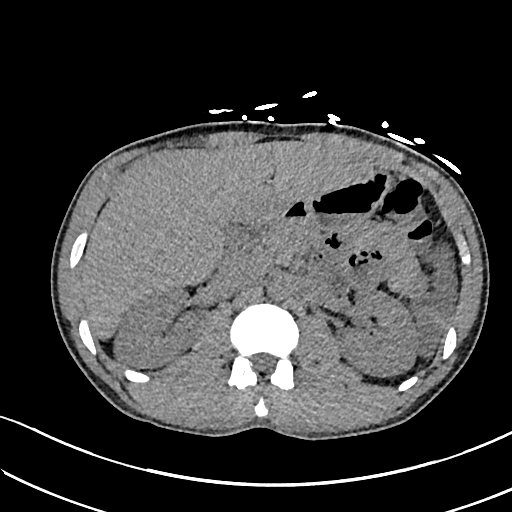
[im 12/165  lung]
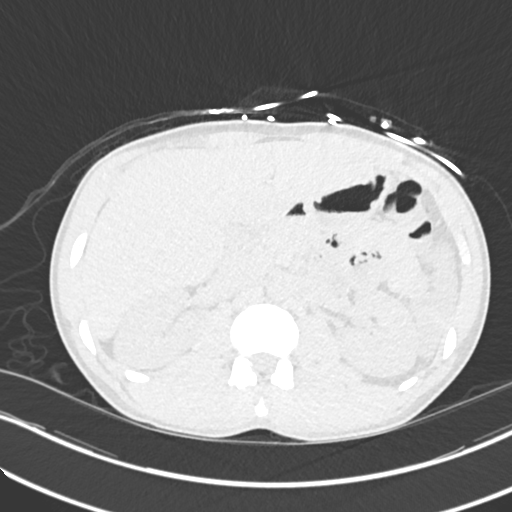
[im 24/165  lung]
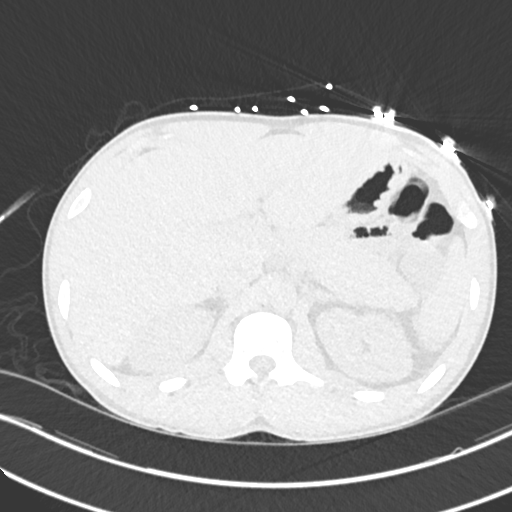
[im 36/165  lung]
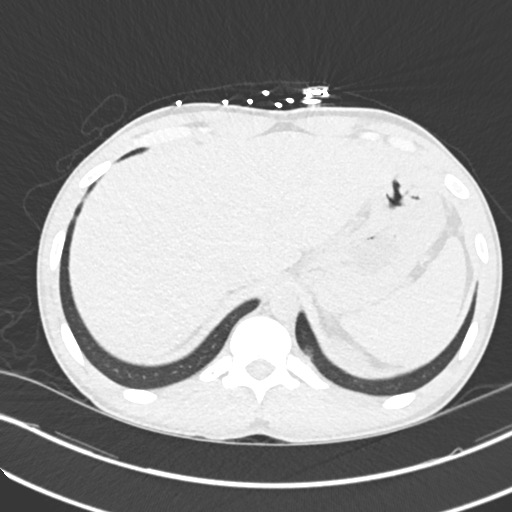
[im 47/165  lung]
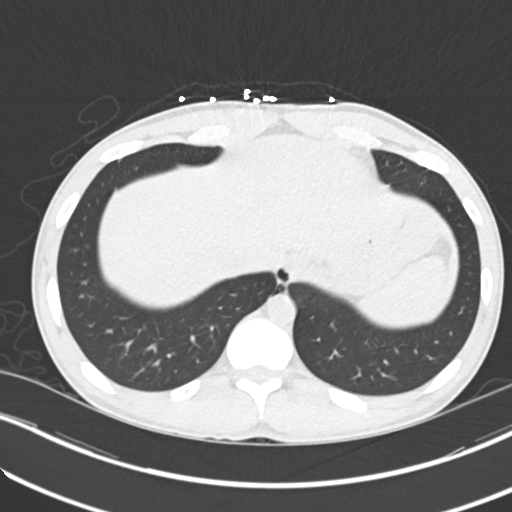
[im 59/165  mediastinal]
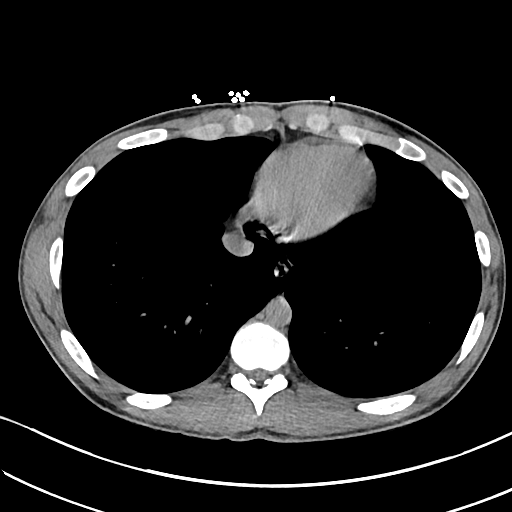
[im 59/165  lung]
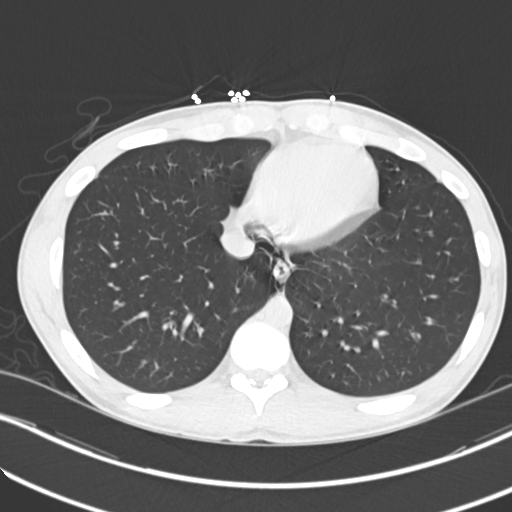
[im 71/165  lung]
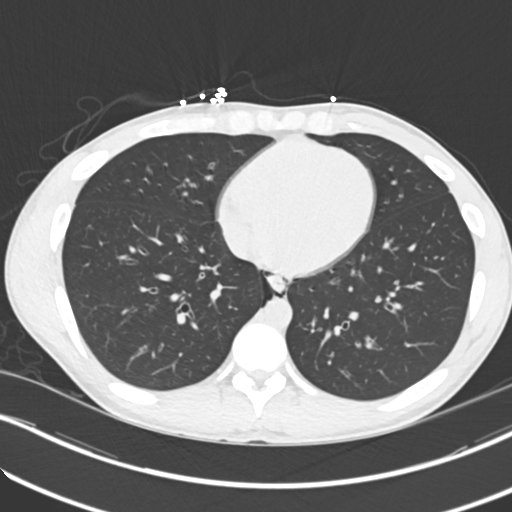
[im 94/165  lung]
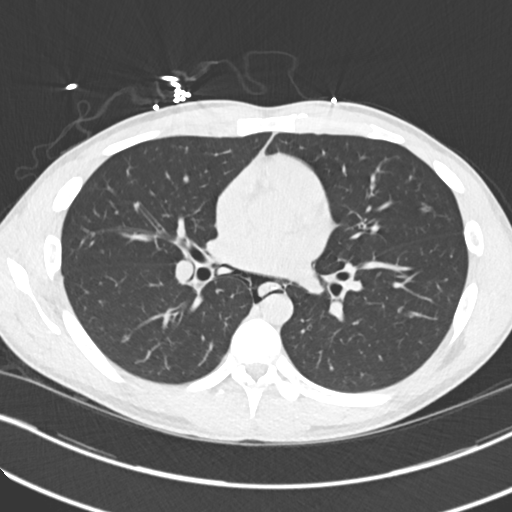
[im 106/165  lung]
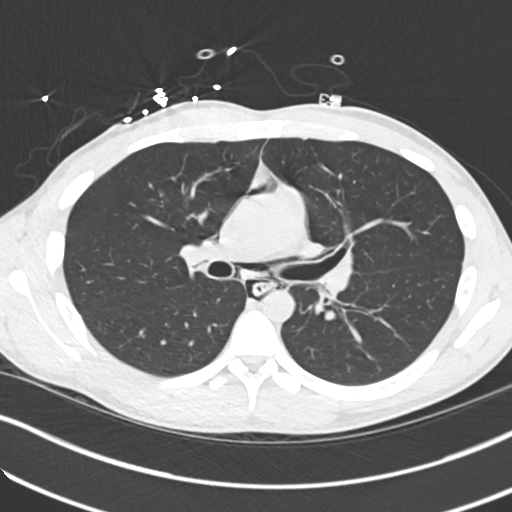
[im 118/165  mediastinal]
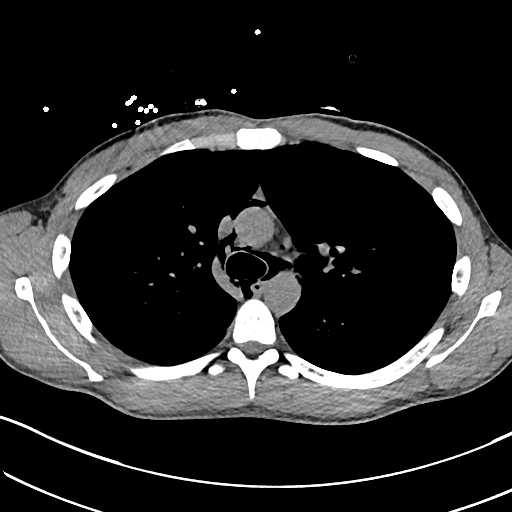
[im 118/165  lung]
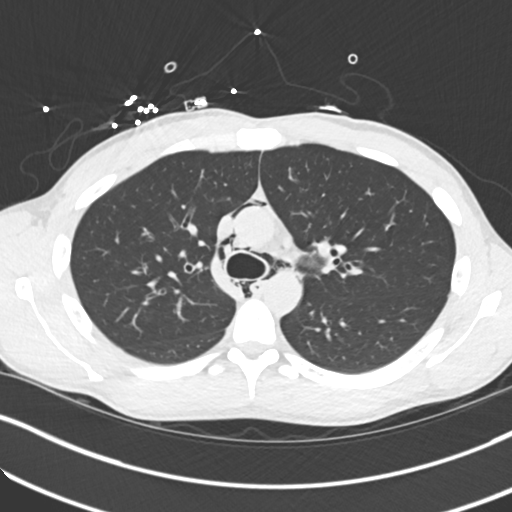
[im 129/165  lung]
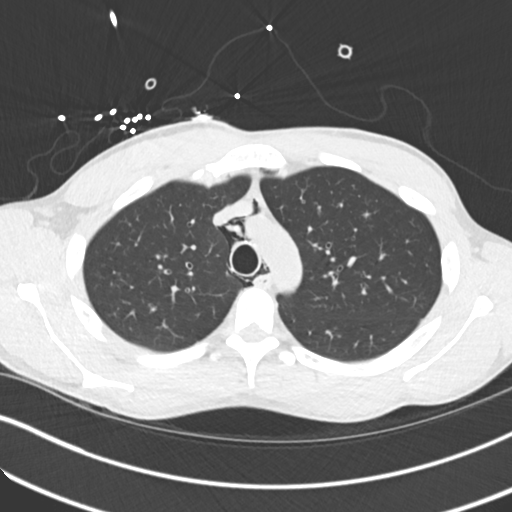
[im 141/165  lung]
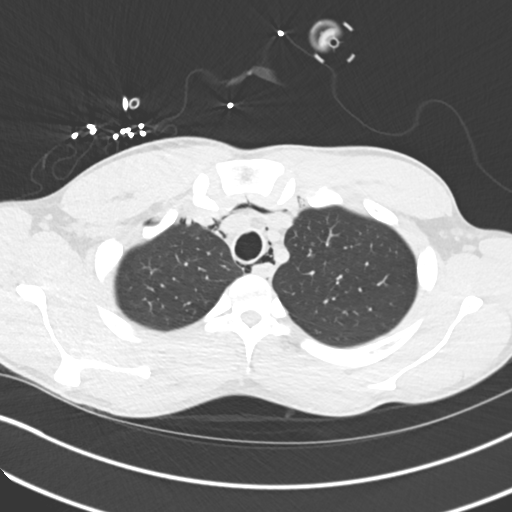
[im 153/165  lung]
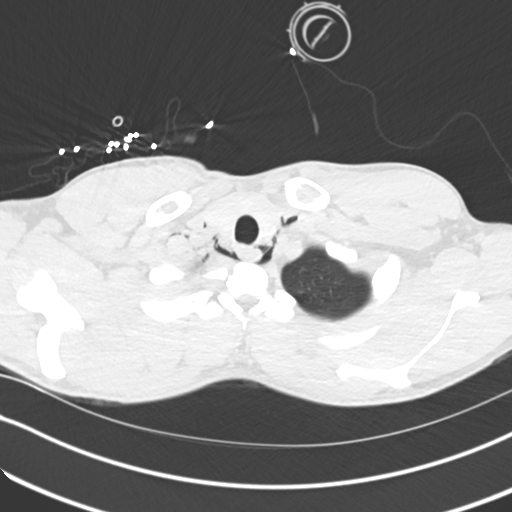

[Series 6: cor · coronal · 0.71mm/px · 3 of 119 slices shown]
[im 24/119  lung]
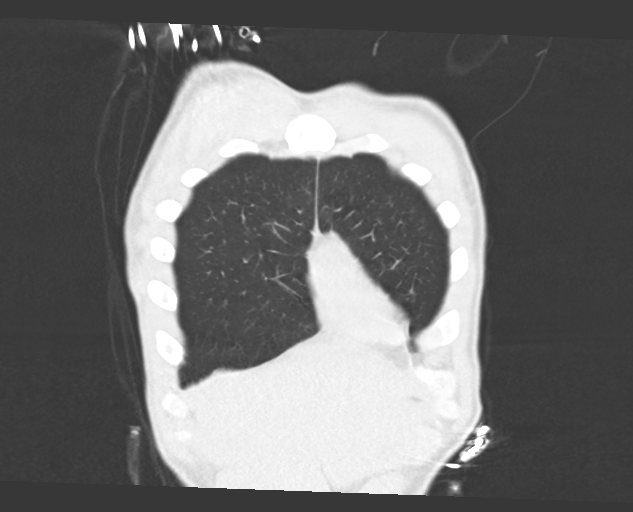
[im 48/119  lung]
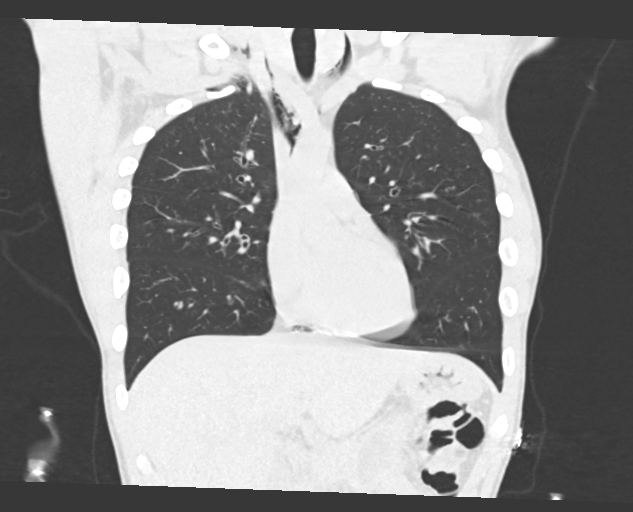
[im 71/119  lung]
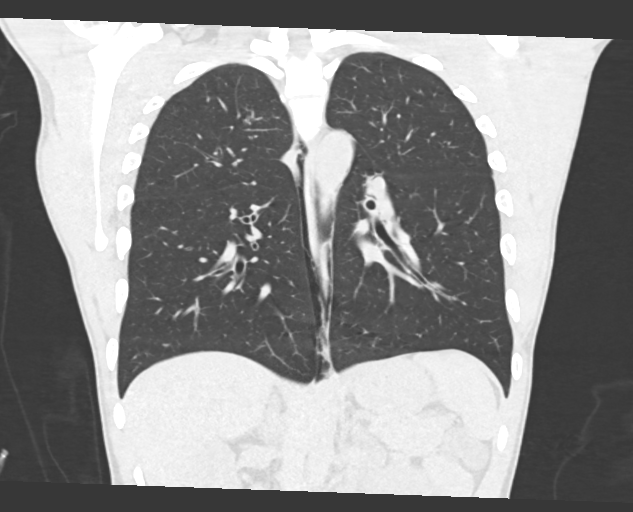

[15 of 36 positions shown; findings below may reference images not displayed]

FINDINGS: Cardiovascular: Heart size is normal. There is no significant
pericardial fluid, thickening or pericardial calcification. No
atherosclerotic calcifications are noted in the thoracic aorta or
the coronary arteries.

Mediastinum/Nodes: Large amount of pneumomediastinum. No
pathologically enlarged mediastinal or hilar lymph nodes. Esophagus
is unremarkable in appearance. No axillary lymphadenopathy.

Lungs/Pleura: No pneumothorax. No acute consolidative airspace
disease. No pleural effusions. No suspicious appearing pulmonary
nodules or masses are noted.

Upper Abdomen: Unremarkable.

Musculoskeletal: There are no aggressive appearing lytic or blastic
lesions noted in the visualized portions of the skeleton.
IMPRESSION: 1. Study is positive for pneumomediastinum. This may be spontaneous
in the setting of asthma. No definite cause of pneumomediastinum is
confidently identified on today's examination.
2. No associated pneumothorax.

## 2022-11-07 IMAGING — DX DG CHEST 1V PORT
1 series · 1 of 1 positions shown · non-contrast
Comparison: 09/05/2020

CLINICAL DATA: Shortness of breath.  Asthma.

EXAM:
PORTABLE CHEST 1 VIEW

[chest]
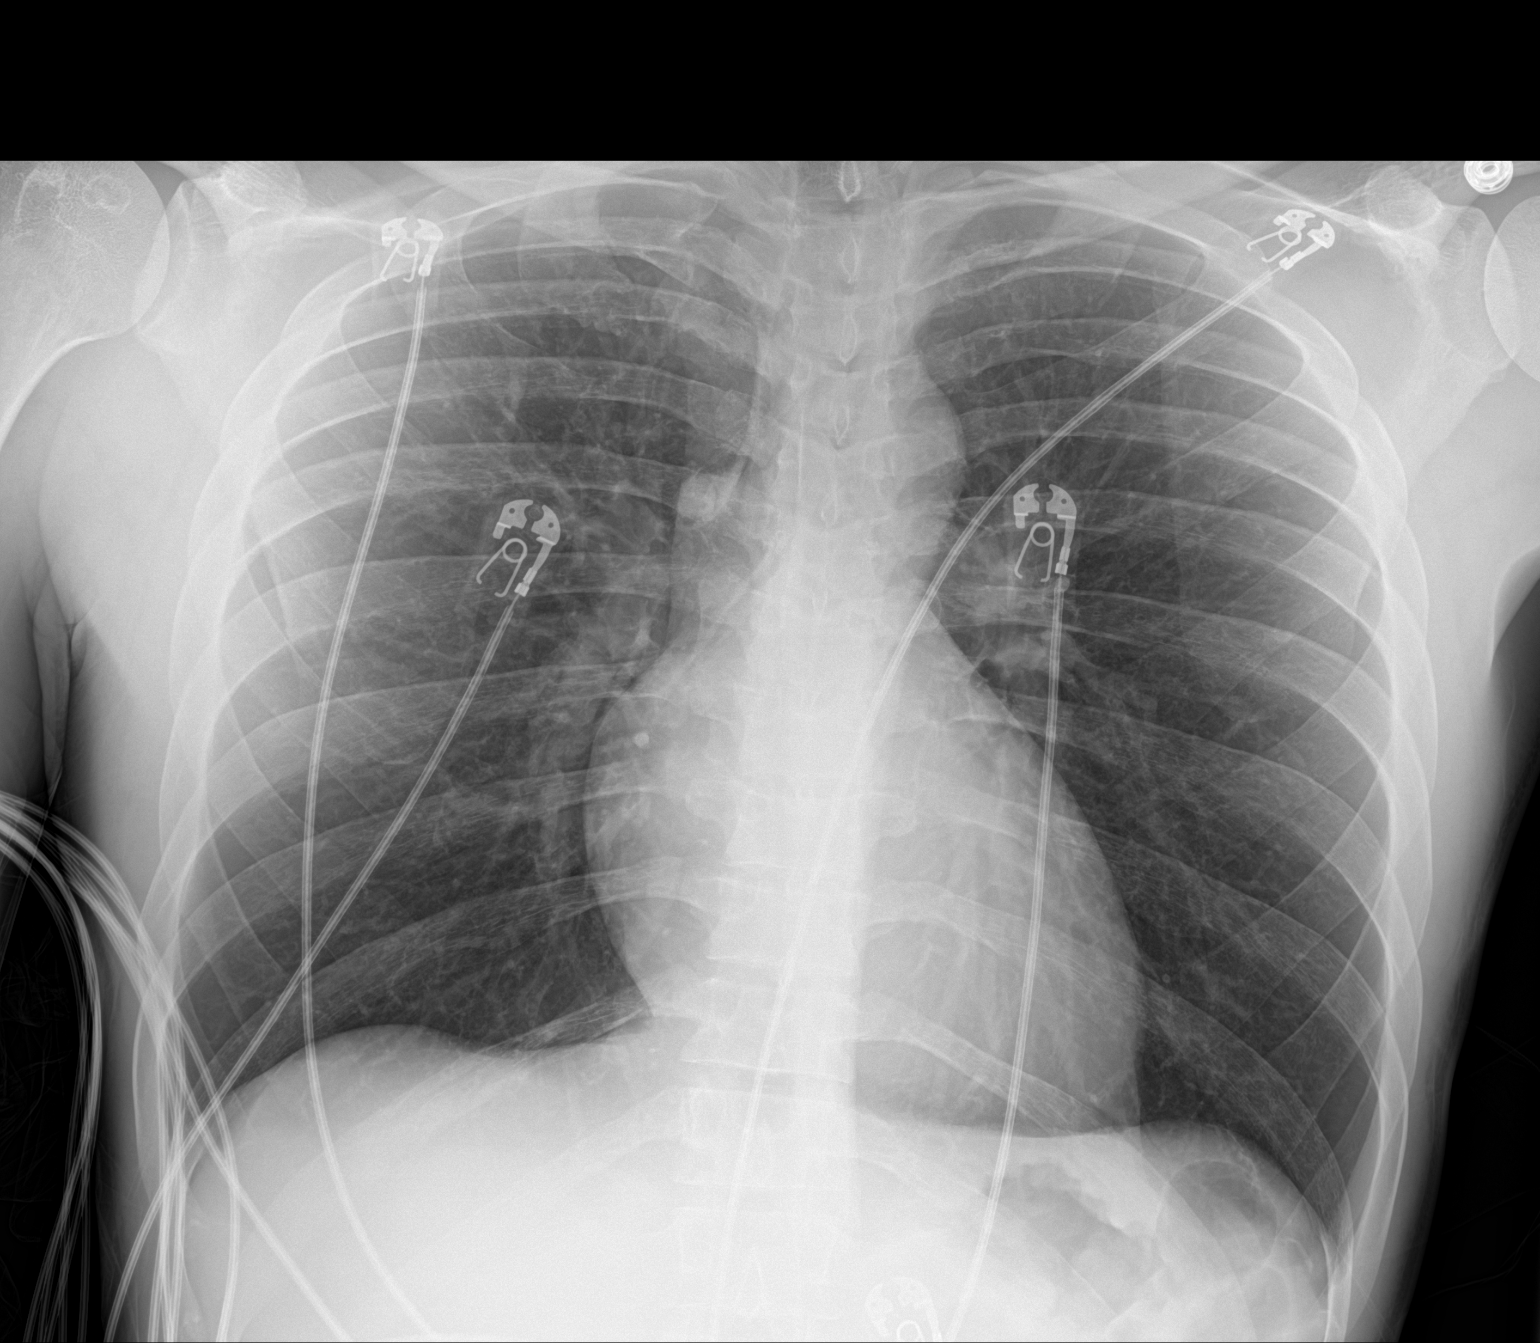

[1 of 1 positions shown; findings below may reference images not displayed]

FINDINGS: Artifact from EKG leads.

Prominent outlining of left hilum and carina structures. Normal
heart size and mediastinal contours. There is no edema,
consolidation, effusion, or pneumothorax.
IMPRESSION: Possible mild pneumomediastinum.  No pneumothorax or infiltrate.

## 2023-05-29 DIAGNOSIS — S42252A Displaced fracture of greater tuberosity of left humerus, initial encounter for closed fracture: Secondary | ICD-10-CM | POA: Insufficient documentation

## 2023-05-29 DIAGNOSIS — M25522 Pain in left elbow: Secondary | ICD-10-CM | POA: Insufficient documentation

## 2023-05-29 DIAGNOSIS — Y9302 Activity, running: Secondary | ICD-10-CM | POA: Insufficient documentation

## 2023-05-29 DIAGNOSIS — S00511A Abrasion of lip, initial encounter: Secondary | ICD-10-CM | POA: Insufficient documentation

## 2023-05-29 DIAGNOSIS — W108XXA Fall (on) (from) other stairs and steps, initial encounter: Secondary | ICD-10-CM | POA: Insufficient documentation

## 2023-05-30 ENCOUNTER — Emergency Department (HOSPITAL_COMMUNITY)
Admission: EM | Admit: 2023-05-30 | Discharge: 2023-05-30 | Disposition: A | Discharge status: Home / Self Care | Payer: Self-pay | Attending: Emergency Medicine | Admitting: Emergency Medicine

## 2023-05-30 ENCOUNTER — Other Ambulatory Visit: Payer: Self-pay

## 2023-05-30 ENCOUNTER — Emergency Department (HOSPITAL_COMMUNITY): Payer: Self-pay

## 2023-05-30 ENCOUNTER — Encounter (HOSPITAL_COMMUNITY): Payer: Self-pay

## 2023-05-30 DIAGNOSIS — S4292XA Fracture of left shoulder girdle, part unspecified, initial encounter for closed fracture: Secondary | ICD-10-CM

## 2023-05-30 MED ORDER — IBUPROFEN 400 MG PO TABS: 400.0000 mg | ORAL_TABLET | Freq: Three times a day (TID) | ORAL | 0 refills | Status: AC | PRN

## 2023-05-30 MED ORDER — HYDROCODONE-ACETAMINOPHEN 5-325 MG PO TABS
1.0000 | ORAL_TABLET | Freq: Once | ORAL | Status: AC
  Administered 2023-05-30: 1 via ORAL
  Filled 2023-05-30: qty 1

## 2023-05-30 MED ORDER — IBUPROFEN 400 MG PO TABS
400.0000 mg | ORAL_TABLET | Freq: Once | ORAL | Status: AC
  Administered 2023-05-30: 400 mg via ORAL
  Filled 2023-05-30: qty 1

## 2023-05-30 MED ORDER — HYDROCODONE-ACETAMINOPHEN 5-325 MG PO TABS: 1.0000 | ORAL_TABLET | Freq: Four times a day (QID) | ORAL | 0 refills | Status: AC | PRN

## 2023-05-30 NOTE — ED Triage Notes (Signed)
Pt arrives with c/o left shoulder injury after a fall. Per pt, he was running up a set of steps and fell on his left shoulder. Possible deformity to left shoulder.

## 2023-05-30 NOTE — Progress Notes (Signed)
Orthopedic Tech Progress Note Patient Details:  Christian Salazar 12-28-1990 [id number]  Ortho Devices Type of Ortho Device: Sling immobilizer Ortho Device/Splint Location: lue Ortho Device/Splint Interventions: Ordered, Application, Adjustment  I applied the sling and taught the patient how to apply and adjust it. Post Interventions Patient Tolerated: Well Instructions Provided: Care of device, Adjustment of device  [name] 05/30/2023, 1:48 AM

## 2023-05-30 NOTE — ED Provider Notes (Signed)
[place] EMERGENCY DEPARTMENT AT [facility] Provider Note   CSN: [id number] Arrival date & time: 05/29/23  2357     History  Chief Complaint  Patient presents with   Shoulder Injury    Christian Salazar is a 32 y.o. male.  The history is provided by the patient.  Patient presents for a shoulder injury.  Patient reports he was running up steps when he fell forward landing on his left shoulder.  He also reports he hit his face and has a small cut to his lip.  No LOC, no headache.  Most of the pain is in his left shoulder and elbow.  He reports he was able to use his left arm initially to get off the ground, but then it started having pain with any movement No other acute complaints     Home Medications Prior to Admission medications   Medication Sig Start Date End Date Taking? Authorizing Provider  HYDROcodone-acetaminophen (NORCO/VICODIN) 5-325 MG tablet Take 1 tablet by mouth every 6 (six) hours as needed. 05/30/23  Yes Zadie Rhine, MD  ibuprofen (ADVIL) 400 MG tablet Take 1 tablet (400 mg total) by mouth every 8 (eight) hours as needed for moderate pain. 05/30/23  Yes Zadie Rhine, MD  albuterol (VENTOLIN HFA) 108 (90 Base) MCG/ACT inhaler Inhale 2 puffs into the lungs every 4 (four) hours as needed for wheezing or shortness of breath. 04/06/22   Tilden Fossa, MD  levETIRAcetam (KEPPRA) 500 MG tablet Take 1 tablet (500 mg total) by mouth 2 (two) times daily. Patient not taking: Reported on 06/06/2020 05/13/20   Maczis, Elmer Sow, PA-C      Allergies    Shrimp [shellfish allergy] and Fish allergy    Review of Systems   Review of Systems  Musculoskeletal:  Positive for arthralgias.  Skin:  Positive for wound.  Neurological:  Negative for headaches.    Physical Exam Updated Vital Signs BP (!) 134/98 (BP Location: Right Arm)   Pulse 94   Temp 98.8 F (37.1 C) (Oral)   Resp (!) 21   Wt 72.6 kg   SpO2 100%   BMI 24.33 kg/m  Physical  Exam CONSTITUTIONAL: Disheveled, no acute distress HEAD: Normocephalic/atraumatic EYES: EOMI/PERRL ENMT: Mucous membranes moist, swelling and small abrasion noted to left lip mucosa.  There is no dental injury.  No malocclusion or trismus.  No facial tenderness.  No nasal tenderness NECK: supple no meningeal signs SPINE/BACK:entire spine nontender Chest-no tenderness noted NEURO: Pt is awake/alert/appropriate, moves all extremitiesx4.  No facial droop.  GCS 15 EXTREMITIES: pulses normal/equal, left shoulder tender to palpation.  Left elbow is tender to palpation with abrasion noted.  Distal pulses intact in left arm.  He is able to wiggle fingers on left hand and has appropriate grip strength SKIN: warm, color normal PSYCH: She was  ED Results / Procedures / Treatments   Labs (all labs ordered are listed, but only abnormal results are displayed) Labs Reviewed - No data to display  EKG None  Radiology DG Elbow Complete Left  Result Date: 05/30/2023 CLINICAL DATA:  Left shoulder fracture, left elbow injury EXAM: LEFT ELBOW - COMPLETE 3+ VIEW COMPARISON:  None Available. FINDINGS: There is no evidence of fracture, dislocation, or joint effusion. There is no evidence of arthropathy or other focal bone abnormality. Soft tissues are unremarkable. IMPRESSION: Negative. Electronically Signed   By: Helyn Numbers M.D.   On: 05/30/2023 01:14   DG Shoulder Left Portable  Result Date: 05/30/2023  CLINICAL DATA:  Left shoulder injury EXAM: LEFT SHOULDER COMPARISON:  None Available. FINDINGS: Three view radiograph left shoulder demonstrates a mildly displaced, mildly comminuted greater tuberosity fracture. No dislocation. Acromioclavicular joint space is preserved. Glenohumeral joint space is not well profiled. Limited images of the left hemithorax are unremarkable. IMPRESSION: 1. Mildly displaced, mildly comminuted greater tuberosity fracture. Electronically Signed   By: Helyn Numbers M.D.   On:  05/30/2023 01:13    Procedures .Ortho Injury Treatment  Date/Time: 05/30/2023 1:39 AM  Performed by: Zadie Rhine, MD Authorized by: Zadie Rhine, MD   Consent:    Consent obtained:  Verbal   Consent given by:  PatientInjury location: shoulder Location details: left shoulder Injury type: fracture Fracture type: greater humeral tuberosity Pre-procedure neurovascular assessment: neurovascularly intact Pre-procedure distal perfusion: normal Pre-procedure neurological function: normal Pre-procedure range of motion: reduced Immobilization: sling Splint Applied by: Milon Dikes Post-procedure neurovascular assessment: post-procedure neurovascularly intact Post-procedure distal perfusion: normal Post-procedure neurological function: normal Post-procedure range of motion: unchanged       Medications Ordered in ED Medications  HYDROcodone-acetaminophen (NORCO/VICODIN) 5-325 MG per tablet 1 tablet (1 tablet Oral Given 05/30/23 0103)  ibuprofen (ADVIL) tablet 400 mg (400 mg Oral Given 05/30/23 0103)    ED Course/ Medical Decision Making/ A&P Clinical Course as of 05/30/23 0156  Sun May 30, 2023  0155 Patient presents after accidental fall running up steps.  He sustained injury to his left shoulder.  He does have swelling to his lips from the fall, and a small laceration but there is no dental injury or any other facial trauma.  He is awake and alert.  He has no spinal tenderness, no signs of any chest or abdominal trauma.  Patient has been ambulatory [DW]  0155 Patient found to have fracture of the greater tuberosity of the left shoulder.  There is no dislocation present.  There is no evidence of any elbow fracture.  Patient was placed in sling immobilizer.  He will be given course of pain medication, as well as referral to orthopedics.  We discussed appropriate use of his sling. [DW]    Clinical Course User Index [DW] Zadie Rhine, MD                                 Medical  Decision Making Amount and/or Complexity of Data Reviewed Radiology: ordered.  Risk Prescription drug management.           Final Clinical Impression(s) / ED Diagnoses Final diagnoses:  Closed fracture of left shoulder, initial encounter    Rx / DC Orders ED Discharge Orders          Ordered    HYDROcodone-acetaminophen (NORCO/VICODIN) 5-325 MG tablet  Every 6 hours PRN        05/30/23 0154    ibuprofen (ADVIL) 400 MG tablet  Every 8 hours PRN        05/30/23 0156              Zadie Rhine, MD 05/30/23 [id number]
